# Patient Record
Sex: Female | Born: 1980 | Race: Black or African American | Hispanic: No | Marital: Single | Smoking: Never smoker
Health system: Southern US, Community
[De-identification: ages and names within clinical notes are randomized; demographics above are authoritative.]

## PROBLEM LIST (undated history)

## (undated) DIAGNOSIS — D649 Anemia, unspecified: Secondary | ICD-10-CM

## (undated) DIAGNOSIS — E611 Iron deficiency: Secondary | ICD-10-CM

## (undated) DIAGNOSIS — D219 Benign neoplasm of connective and other soft tissue, unspecified: Secondary | ICD-10-CM

## (undated) DIAGNOSIS — R7303 Prediabetes: Secondary | ICD-10-CM

## (undated) HISTORY — DX: Anemia, unspecified: D64.9

## (undated) HISTORY — DX: Prediabetes: R73.03

## (undated) HISTORY — DX: Benign neoplasm of connective and other soft tissue, unspecified: D21.9

## (undated) HISTORY — DX: Iron deficiency: E61.1

## (undated) HISTORY — PX: CHOLECYSTECTOMY: SHX55

---

## 2018-07-15 ENCOUNTER — Encounter: Payer: Self-pay | Admitting: Oncology

## 2018-07-15 ENCOUNTER — Telehealth: Payer: Self-pay | Admitting: Oncology

## 2018-07-15 NOTE — Telephone Encounter (Signed)
New hematology referral received from Dr. Garwin Brothers. Seth Bake from [facility] cld to schedule the pt an appt. Appt scheduled for the pt to see Dr. Alen Blew on 10/17 at 2pm. Letter mailed.

## 2018-08-11 ENCOUNTER — Telehealth: Payer: Self-pay | Admitting: Oncology

## 2018-08-11 ENCOUNTER — Inpatient Hospital Stay: Payer: BC Managed Care – PPO

## 2018-08-11 ENCOUNTER — Other Ambulatory Visit: Payer: Self-pay | Admitting: *Deleted

## 2018-08-11 ENCOUNTER — Inpatient Hospital Stay: Payer: BC Managed Care – PPO | Attending: Oncology | Admitting: Oncology

## 2018-08-11 VITALS — BP 148/95 | HR 79 | Temp 98.6°F | Resp 18 | Ht 65.0 in | Wt 373.3 lb

## 2018-08-11 DIAGNOSIS — D649 Anemia, unspecified: Secondary | ICD-10-CM

## 2018-08-11 DIAGNOSIS — D573 Sickle-cell trait: Secondary | ICD-10-CM | POA: Diagnosis not present

## 2018-08-11 DIAGNOSIS — N920 Excessive and frequent menstruation with regular cycle: Secondary | ICD-10-CM | POA: Diagnosis not present

## 2018-08-11 DIAGNOSIS — K912 Postsurgical malabsorption, not elsewhere classified: Secondary | ICD-10-CM | POA: Diagnosis not present

## 2018-08-11 DIAGNOSIS — Z9884 Bariatric surgery status: Secondary | ICD-10-CM | POA: Diagnosis not present

## 2018-08-11 DIAGNOSIS — D508 Other iron deficiency anemias: Secondary | ICD-10-CM | POA: Insufficient documentation

## 2018-08-11 LAB — CBC WITH DIFFERENTIAL (CANCER CENTER ONLY)
Abs Immature Granulocytes: 0.04 10*3/uL (ref 0.00–0.07)
Basophils Absolute: 0.1 10*3/uL (ref 0.0–0.1)
Basophils Relative: 1 %
Eosinophils Absolute: 0.1 10*3/uL (ref 0.0–0.5)
Eosinophils Relative: 1 %
HCT: 28.6 % — ABNORMAL LOW (ref 36.0–46.0)
Hemoglobin: 8.3 g/dL — ABNORMAL LOW (ref 12.0–15.0)
Immature Granulocytes: 1 %
Lymphocytes Relative: 27 %
Lymphs Abs: 2 10*3/uL (ref 0.7–4.0)
MCH: 18.7 pg — ABNORMAL LOW (ref 26.0–34.0)
MCHC: 29 g/dL — ABNORMAL LOW (ref 30.0–36.0)
MCV: 64.4 fL — ABNORMAL LOW (ref 80.0–100.0)
Monocytes Absolute: 0.5 10*3/uL (ref 0.1–1.0)
Monocytes Relative: 7 %
NEUTROS ABS: 4.7 10*3/uL (ref 1.7–7.7)
Neutrophils Relative %: 63 %
PLATELETS: 594 10*3/uL — AB (ref 150–400)
RBC: 4.44 MIL/uL (ref 3.87–5.11)
RDW: 18.2 % — ABNORMAL HIGH (ref 11.5–15.5)
WBC Count: 7.5 10*3/uL (ref 4.0–10.5)
nRBC: 0 % (ref 0.0–0.2)

## 2018-08-11 MED ORDER — FERROUS SULFATE 325 (65 FE) MG PO TBEC: 325.0000 mg | DELAYED_RELEASE_TABLET | Freq: Every day | ORAL | 3 refills | Status: DC

## 2018-08-11 NOTE — Telephone Encounter (Signed)
Correction Patient was given schedule for December and March.

## 2018-08-11 NOTE — Progress Notes (Signed)
Reason for the request:   Anemia  HPI: I was asked by Dr. Garwin Brothers to evaluate Elizabeth Skinner for the evaluation of anemia.  She is a 37 year old woman with history of gastric bypass operation in 2007.  She had a routine examination by Dr. Garwin Brothers laboratory testing in October 2018 showed a ferritin of 3, iron of 17, iron saturation 4% with binding capacity 432.  Her hemoglobin at the time was 8.4, MCV of 61 and white cell count of 7.7.  Blood count were 631 with elevated RDW of 16.6.  Hemoglobin electrophoresis obtained on October 30 showed a hemoglobin A of 67.8 and hemoglobin S of 29.1 indicating sickle cell trait.  She is mildly symptomatic from these findings without any excessive fatigue, tiredness or shortness of breath.  She does report regular menstrual cycles lasting close to 5 days every month.  He denies any hematochezia or melena.  She has been diagnosed with uterine fibroids and contemplating options for treatment including ablation, embolization and hysterectomy.  Overall performance status quality of life remain excellent.  She does not report any headaches, blurry vision, syncope or seizures. Does not report any fevers, chills or sweats.  Does not report any cough, wheezing or hemoptysis.  Does not report any chest pain, palpitation, orthopnea or leg edema.  Does not report any nausea, vomiting or abdominal pain.  Does not report any constipation or diarrhea.  Does not report any skeletal complaints.    Does not report frequency, urgency or hematuria.  Does not report any skin rashes or lesions. Does not report any heat or cold intolerance.  Does not report any lymphadenopathy or petechiae.  Does not report any anxiety or depression.  Remaining review of systems is negative.    No past medical history significant for hypertension, diabetes or coronary disease.  Status post bypass operation as well as cholecystectomy.    No known allergies.  No family history of any blood or malignant  disorder.  Social History   Socioeconomic History  . Marital status: Single    Spouse name: Not on file  . Number of children: Not on file  . Years of education: Not on file  . Highest education level: Not on file  Occupational History  . Not on file  Social Needs  . Financial resource strain: Not on file  . Food insecurity:    Worry: Not on file    Inability: Not on file  . Transportation needs:    Medical: Not on file    Non-medical: Not on file  Tobacco Use  . Smoking status: Not on file  Substance and Sexual Activity  . Alcohol use: Not on file  . Drug use: Not on file  . Sexual activity: Not on file  Lifestyle  . Physical activity:    Days per week: Not on file    Minutes per session: Not on file  . Stress: Not on file  Relationships  . Social connections:    Talks on phone: Not on file    Gets together: Not on file    Attends religious service: Not on file    Active member of club or organization: Not on file    Attends meetings of clubs or organizations: Not on file    Relationship status: Not on file  . Intimate partner violence:    Fear of current or ex partner: Not on file    Emotionally abused: Not on file    Physically abused: Not on file  Forced sexual activity: Not on file  Other Topics Concern  . Not on file  Social History Narrative  . Not on file  :  Pertinent items are noted in HPI.  Exam: Blood pressure (!) 148/95, pulse 79, temperature 98.6 F (37 C), temperature source Oral, resp. rate 18, height 5\' 5"  (1.651 m), weight (!) 373 lb 4.8 oz (169.3 kg), SpO2 100 %.   General appearance: alert and cooperative appeared without distress. Head: atraumatic without any abnormalities. Eyes: conjunctivae/corneas clear. PERRL.  Sclera anicteric. Throat: lips, mucosa, and tongue normal; without oral thrush or ulcers. Resp: clear to auscultation bilaterally without rhonchi, wheezes or dullness to percussion. Cardio: regular rate and rhythm, S1, S2  normal, no murmur, click, rub or gallop GI: soft, non-tender; bowel sounds normal; no masses,  no organomegaly Skin: Skin color, texture, turgor normal. No rashes or lesions Lymph nodes: Cervical, supraclavicular, and axillary nodes normal. Neurologic: Grossly normal without any motor, sensory or deep tendon reflexes. Musculoskeletal: No joint deformity or effusion.    Assessment and Plan:   37 year old woman with the following:  1.  Iron deficiency anemia diagnosed in October 2019.  Her hemoglobin is around 8 with low MCV and ferritin of 3.  Her iron deficiency is multifactorial related to chronic menstrual blood losses in addition to poor absorption issues related to gastric bypass operation.  Treatment options were reviewed including oral iron replacement versus intravenous IV iron in the form of Feraheme.  Risks and benefits of both approaches were reviewed today including complication associated with oral iron absorption.  Dyspepsia, constipation and abdominal pain is associated with oral iron.  Intravenous iron benefit would include ensuring adequate absorption as well as rapid replacement of her iron.  Infusion related complications associated with intravenous iron were reviewed including rarely anaphylaxis.  After discussion she is agreeable to proceed with Feraheme for a total of 1000 mg split doses I will also start her oral iron maintenance therapy as well.  I anticipate correction of her iron and hemoglobin in the next 4 to 6 weeks.  2.  Sickle cell trait: She is asymptomatic from this finding and likely her baseline hemoglobin and microcytosis will be less than normal but will be assessed after adequate iron replacement.  3.  Follow-up: We will be in 3 months to follow her progress.  40  minutes was spent with the patient face-to-face today.  More than 50% of time was dedicated to reviewing laboratory data, options of therapy and complications related to treatment.   Thank you  for the referral.  A copy of this consult has been forwarded to the requesting physician.

## 2018-08-11 NOTE — Telephone Encounter (Signed)
Managed cared informed of iron 12/23.

## 2018-08-11 NOTE — Telephone Encounter (Signed)
Gave patient avs report and appointments for December and January  °

## 2018-08-12 LAB — FERRITIN: Ferritin: <4 ng/mL — ABNORMAL LOW (ref 11–307)

## 2018-08-12 LAB — IRON AND TIBC
Iron: 43 ug/dL (ref 41–142)
Saturation Ratios: 10 % — ABNORMAL LOW (ref 21–57)
TIBC: 418 ug/dL (ref 236–444)
UIBC: 374 ug/dL (ref 120–384)

## 2018-08-17 ENCOUNTER — Inpatient Hospital Stay: Payer: BC Managed Care – PPO

## 2018-08-17 VITALS — BP 140/89 | HR 72 | Temp 98.0°F | Resp 18

## 2018-08-17 DIAGNOSIS — D508 Other iron deficiency anemias: Secondary | ICD-10-CM | POA: Diagnosis not present

## 2018-08-17 DIAGNOSIS — D649 Anemia, unspecified: Secondary | ICD-10-CM

## 2018-08-17 MED ORDER — SODIUM CHLORIDE 0.9 % IV SOLN
Freq: Once | INTRAVENOUS | Status: AC
  Administered 2018-08-17: 08:00:00 via INTRAVENOUS
  Filled 2018-08-17: qty 250

## 2018-08-17 MED ORDER — SODIUM CHLORIDE 0.9 % IV SOLN
510.0000 mg | Freq: Once | INTRAVENOUS | Status: AC
  Administered 2018-08-17: 510 mg via INTRAVENOUS
  Filled 2018-08-17: qty 17

## 2018-08-17 NOTE — Patient Instructions (Signed)

## 2018-08-24 ENCOUNTER — Inpatient Hospital Stay: Payer: BC Managed Care – PPO

## 2018-08-29 ENCOUNTER — Inpatient Hospital Stay: Payer: BC Managed Care – PPO | Attending: Oncology

## 2018-08-29 VITALS — BP 114/75 | HR 62 | Temp 97.8°F | Resp 16

## 2018-08-29 DIAGNOSIS — D508 Other iron deficiency anemias: Secondary | ICD-10-CM | POA: Diagnosis present

## 2018-08-29 DIAGNOSIS — K912 Postsurgical malabsorption, not elsewhere classified: Secondary | ICD-10-CM | POA: Insufficient documentation

## 2018-08-29 DIAGNOSIS — D573 Sickle-cell trait: Secondary | ICD-10-CM | POA: Insufficient documentation

## 2018-08-29 DIAGNOSIS — D649 Anemia, unspecified: Secondary | ICD-10-CM

## 2018-08-29 DIAGNOSIS — Z9884 Bariatric surgery status: Secondary | ICD-10-CM | POA: Insufficient documentation

## 2018-08-29 DIAGNOSIS — N920 Excessive and frequent menstruation with regular cycle: Secondary | ICD-10-CM | POA: Diagnosis not present

## 2018-08-29 MED ORDER — SODIUM CHLORIDE 0.9 % IV SOLN
Freq: Once | INTRAVENOUS | Status: AC
  Administered 2018-08-29: 13:00:00 via INTRAVENOUS
  Filled 2018-08-29: qty 250

## 2018-08-29 MED ORDER — SODIUM CHLORIDE 0.9 % IV SOLN
510.0000 mg | Freq: Once | INTRAVENOUS | Status: AC
  Administered 2018-08-29: 510 mg via INTRAVENOUS
  Filled 2018-08-29: qty 17

## 2018-08-29 NOTE — Patient Instructions (Signed)

## 2018-09-22 ENCOUNTER — Encounter (INDEPENDENT_AMBULATORY_CARE_PROVIDER_SITE_OTHER): Payer: BC Managed Care – PPO

## 2018-09-24 ENCOUNTER — Encounter (INDEPENDENT_AMBULATORY_CARE_PROVIDER_SITE_OTHER): Payer: Self-pay | Admitting: Bariatrics

## 2018-09-24 ENCOUNTER — Ambulatory Visit (INDEPENDENT_AMBULATORY_CARE_PROVIDER_SITE_OTHER): Payer: BC Managed Care – PPO | Admitting: Bariatrics

## 2018-09-24 VITALS — BP 124/82 | HR 65 | Temp 98.0°F | Ht 66.0 in | Wt 363.0 lb

## 2018-09-24 DIAGNOSIS — Z1331 Encounter for screening for depression: Secondary | ICD-10-CM | POA: Diagnosis not present

## 2018-09-24 DIAGNOSIS — R5383 Other fatigue: Secondary | ICD-10-CM | POA: Diagnosis not present

## 2018-09-24 DIAGNOSIS — D508 Other iron deficiency anemias: Secondary | ICD-10-CM

## 2018-09-24 DIAGNOSIS — R7303 Prediabetes: Secondary | ICD-10-CM | POA: Diagnosis not present

## 2018-09-24 DIAGNOSIS — Z9889 Other specified postprocedural states: Secondary | ICD-10-CM

## 2018-09-24 DIAGNOSIS — Z6841 Body Mass Index (BMI) 40.0 and over, adult: Secondary | ICD-10-CM

## 2018-09-24 DIAGNOSIS — E66813 Obesity, class 3: Secondary | ICD-10-CM

## 2018-09-24 DIAGNOSIS — R0602 Shortness of breath: Secondary | ICD-10-CM | POA: Diagnosis not present

## 2018-09-24 DIAGNOSIS — Z9189 Other specified personal risk factors, not elsewhere classified: Secondary | ICD-10-CM | POA: Diagnosis not present

## 2018-09-24 DIAGNOSIS — E46 Unspecified protein-calorie malnutrition: Secondary | ICD-10-CM

## 2018-09-24 DIAGNOSIS — Z0289 Encounter for other administrative examinations: Secondary | ICD-10-CM

## 2018-09-25 LAB — PREALBUMIN: PREALBUMIN: 16 mg/dL (ref 14–35)

## 2018-09-25 LAB — COMPREHENSIVE METABOLIC PANEL
ALT: 15 IU/L (ref 0–32)
AST: 14 IU/L (ref 0–40)
Albumin/Globulin Ratio: 1.3 (ref 1.2–2.2)
Albumin: 4 g/dL (ref 3.8–4.8)
Alkaline Phosphatase: 81 IU/L (ref 39–117)
BUN/Creatinine Ratio: 12 (ref 9–23)
BUN: 11 mg/dL (ref 6–20)
Bilirubin Total: 0.3 mg/dL (ref 0.0–1.2)
CO2: 25 mmol/L (ref 20–29)
Calcium: 9.3 mg/dL (ref 8.7–10.2)
Chloride: 99 mmol/L (ref 96–106)
Creatinine, Ser: 0.93 mg/dL (ref 0.57–1.00)
GFR calc Af Amer: 90 mL/min/{1.73_m2} (ref >59)
GFR calc non Af Amer: 78 mL/min/{1.73_m2} (ref >59)
GLUCOSE: 83 mg/dL (ref 65–99)
Globulin, Total: 3.2 g/dL (ref 1.5–4.5)
Potassium: 4.7 mmol/L (ref 3.5–5.2)
Sodium: 138 mmol/L (ref 134–144)
Total Protein: 7.2 g/dL (ref 6.0–8.5)

## 2018-09-25 LAB — INSULIN, RANDOM: INSULIN: 7.7 u[IU]/mL (ref 2.6–24.9)

## 2018-09-25 LAB — FOLATE: Folate: >20 ng/mL (ref >3.0)

## 2018-09-25 LAB — VITAMIN D 25 HYDROXY (VIT D DEFICIENCY, FRACTURES): Vit D, 25-Hydroxy: 10.3 ng/mL — ABNORMAL LOW (ref 30.0–100.0)

## 2018-09-25 LAB — VITAMIN B12: Vitamin B-12: 258 pg/mL (ref 232–1245)

## 2018-09-28 DIAGNOSIS — Z6841 Body Mass Index (BMI) 40.0 and over, adult: Secondary | ICD-10-CM

## 2018-09-28 DIAGNOSIS — R7303 Prediabetes: Secondary | ICD-10-CM | POA: Insufficient documentation

## 2018-09-28 DIAGNOSIS — Z9889 Other specified postprocedural states: Secondary | ICD-10-CM | POA: Insufficient documentation

## 2018-09-28 DIAGNOSIS — E46 Unspecified protein-calorie malnutrition: Secondary | ICD-10-CM | POA: Insufficient documentation

## 2018-09-28 NOTE — Progress Notes (Signed)
Office: [phone]  /  Fax: [phone]   Dear Dr. Addison Lank,   Thank you for referring Aiden Rao to our clinic. The following note includes my evaluation and treatment recommendations.  HPI:   Chief Complaint: OBESITY    Amiley Shishido has been referred by Cari Caraway, MD for consultation regarding her obesity and obesity related comorbidities.    Elizabeth Skinner (MR# [id number]) is a 38 y.o. female who presents on 09/24/2018 for obesity evaluation and treatment. Current BMI is Body mass index is 58.59 kg/m.Marland Kitchen Falicity has been struggling with her weight for many years and has been unsuccessful in either losing weight, maintaining weight loss, or reaching her healthy weight goal.     Sheilyn attended our information session and states she is currently in the action stage of change and ready to dedicate time achieving and maintaining a healthier weight. Caylea is interested in becoming our patient and working on intensive lifestyle modifications including (but not limited to) diet, exercise and weight loss.    Georgann states her desired weight loss is 113 lbs she has been heavy most of  her life she started gaining weight in elementary school her heaviest weight ever was 470 lbs. she dislikes seafood she has significant food cravings issues  she snacks frequently in the evenings she does skip meals (breakfast) she is frequently drinking liquids with calories she has problems with excessive hunger  she struggles with emotional eating    History of Gastric Bypass (Roux-en-Y) Avon has a history of gastric bypass approximately in 2007; no co-morbidities, done in [place]. Her highest weight was at 470 pounds and lowest weight was at 240 pounds.  Fatigue Jolee feels her energy is lower than it should be. This has worsened with weight gain and has not worsened recently. Elice admits to daytime somnolence and she denies waking up still tired. Patient is at risk for obstructive sleep apnea. Patent  has a history of symptoms of daytime fatigue. Patient generally gets 7 or 8 hours of sleep per night, and states they generally have restful sleep. Snoring is present. Apneic episodes are not present. Epworth Sleepiness Score is 5  Dyspnea on exertion Armida notes increasing shortness of breath with certain activities (going up stairs) and seems to be worsening over time with weight gain. She notes getting out of breath sooner with activity than she used to. This has not gotten worse recently. Glennys denies orthopnea.  Iron Deficiency Anemia Isabellamarie has a diagnosis of anemia. She has a history of sickle cell trait and uterine fibroids. Zakiyah is taking oral iron supplementation and prenatal vitamins. She has had two iron infusions.  Pre-Diabetes Isobella has a diagnosis of prediabetes based on her elevated Hgb A1c and was informed this puts her at greater risk of developing diabetes. Her last A1c was at 6.1 on 07/02/18. She is not taking metformin currently and continues to work on diet and exercise to decrease risk of diabetes. She denies polyphagia.  At risk for diabetes Savannha is at higher than average risk for developing diabetes due to her obesity and prediabetes. She currently denies polyuria or polydipsia.  Malnutrition Tashiana has a diagnosis of malnutrition and she has a history of gastric bypass.  Depression Screen Kennis's Food and Mood (modified PHQ-9) score was  Depression screen PHQ 2/9 09/24/2018  Decreased Interest 0  Down, Depressed, Hopeless 0  PHQ - 2 Score 0  Altered sleeping 0  Tired, decreased energy 1  Change in appetite 1  Feeling bad or  failure about yourself  0  Trouble concentrating 0  Moving slowly or fidgety/restless 0  Suicidal thoughts 0  PHQ-9 Score 2  Difficult doing work/chores Not difficult at all    ASSESSMENT AND PLAN:  Other fatigue - Plan: EKG 12-Lead  Shortness of breath on exertion  Other iron deficiency anemia  Prediabetes - Plan: Insulin, random,  Comprehensive metabolic panel  Malnutrition, unspecified type (HCC) - Plan: Vitamin B12, Folate, VITAMIN D 25 Hydroxy (Vit-D Deficiency, Fractures), Prealbumin  Depression screening  History of gastric surgery  At risk for diabetes mellitus  Class 3 severe obesity with serious comorbidity and body mass index (BMI) of 50.0 to 59.9 in adult, unspecified obesity type ([place])  PLAN:  Fatigue Barry was informed that her fatigue may be related to obesity, depression or many other causes. Labs will be ordered, and in the meanwhile Earlean has agreed to work on diet, exercise and weight loss to help with fatigue. Proper sleep hygiene was discussed including the need for 7-8 hours of quality sleep each night. A sleep study was not ordered based on symptoms and Epworth score.  Dyspnea on exertion Doreene's shortness of breath appears to be obesity related and exercise induced. She has agreed to work on weight loss and gradually increase exercise to treat her exercise induced shortness of breath. If Veronia follows our instructions and loses weight without improvement of her shortness of breath, we will plan to refer to pulmonology. We will monitor this condition regularly. Susette agrees to this plan.  Iron Deficiency Anemia The diagnosis of Iron deficiency anemia was discussed with Afreen will follow up with hematology. She was given suggestions of iron rich foods.  Pre-Diabetes Lucia will continue to work on weight loss, exercise, and decreasing simple carbohydrates in her diet to help decrease the risk of diabetes. She was informed that eating too many simple carbohydrates or too many calories at one sitting increases the likelihood of GI side effects. We will check insulin level today and Azhane agreed to follow up with [place] as directed to monitor her progress.  Diabetes risk counseling Camaryn was given extended (15 minutes) diabetes prevention counseling today. She is 38 y.o. female and has risk factors for  diabetes including obesity and prediabetes. We discussed intensive lifestyle modifications today with an emphasis on weight loss as well as increasing exercise and decreasing simple carbohydrates in her diet.  Malnutrition We will check B1, B6, B12 and prealbumin today. Celine will follow up with our clinic in 2 weeks.  Depression Screen Sandara had a negative depression screening. Depression is commonly associated with obesity and often results in emotional eating behaviors. We will monitor this closely and work on CBT to help improve the non-hunger eating patterns.   History of Gastric Bypass (Roux-en-Y) We will do B1, B6, B12, vitamin D and Prealbumin today. Treasa will follow up with our clinic in 2 weeks.  Obesity Salimatou is currently in the action stage of change and her goal is to continue with weight loss efforts. I recommend Jamaris begin the structured treatment plan as follows:  She has agreed to follow the Category 3 plan Meyer has been instructed to eventually work up to a goal of 150 minutes of combined cardio and strengthening exercise per week for weight loss and overall health benefits. We discussed the following Behavioral Modification Strategies today: increase H2O intake, keeping healthy foods in the home, increasing lean protein intake, decreasing simple carbohydrates, increasing vegetables, work on meal planning and intentional eating and  decrease liquid calories   She was informed of the importance of frequent follow up visits to maximize her success with intensive lifestyle modifications for her multiple health conditions. She was informed we would discuss her lab results at her next visit unless there is a critical issue that needs to be addressed sooner. Janara agreed to keep her next visit at the agreed upon time to discuss these results.  ALLERGIES: No Known Allergies  MEDICATIONS: Current Outpatient Medications on File Prior to Visit  Medication Sig Dispense Refill  .  ferrous sulfate 325 (65 FE) MG EC tablet Take 1 tablet (325 mg total) by mouth daily with breakfast. 90 tablet 3  . Prenatal Vit-Fe Fumarate-FA (PRENATAL MULTIVITAMIN) TABS tablet Take 1 tablet by mouth daily at 12 noon.     No current facility-administered medications on file prior to visit.     PAST MEDICAL HISTORY: Past Medical History:  Diagnosis Date  . Anemia   . Fibroids   . Iron deficiency   . Pre-diabetes     PAST SURGICAL HISTORY: Past Surgical History:  Procedure Laterality Date  . CHOLECYSTECTOMY      SOCIAL HISTORY: Social History   Tobacco Use  . Smoking status: Never Smoker  . Smokeless tobacco: Never Used  Substance Use Topics  . Alcohol use: Not on file  . Drug use: Not on file    FAMILY HISTORY: Family History  Problem Relation Age of Onset  . Diabetes Mother   . Obesity Mother     ROS: Review of Systems  Constitutional: Positive for malaise/fatigue.  Respiratory: Positive for shortness of breath (on exertion).   Cardiovascular: Negative for orthopnea.  Genitourinary: Negative for frequency.  Endo/Heme/Allergies: Negative for polydipsia.       Negative for polyphagia    PHYSICAL EXAM: Blood pressure 124/82, pulse 65, temperature 98 F (36.7 C), temperature source Oral, height 5\' 6"  (1.676 m), weight (!) 363 lb (164.7 kg), last menstrual period 09/04/2018, SpO2 100 %. Body mass index is 58.59 kg/m. Physical Exam Vitals signs reviewed.  Constitutional:      Appearance: Normal appearance. She is well-developed. She is obese.  HENT:     Head: Normocephalic and atraumatic.     Nose: Nose normal.     Mouth/Throat:     Comments: Mallampati = 4 Eyes:     General: No scleral icterus.    Extraocular Movements: Extraocular movements intact.  Neck:     Musculoskeletal: Normal range of motion and neck supple.     Thyroid: No thyromegaly.  Cardiovascular:     Rate and Rhythm: Normal rate and regular rhythm.  Pulmonary:     Effort: Pulmonary  effort is normal. No respiratory distress.  Abdominal:     Palpations: Abdomen is soft.     Tenderness: There is no abdominal tenderness.  Musculoskeletal: Normal range of motion.     Right lower leg: Edema present.     Left lower leg: Edema present.     Comments: Range of Motion normal in all 4 extremities  Skin:    General: Skin is warm and dry.  Neurological:     Mental Status: She is alert and oriented to person, place, and time.     Coordination: Coordination normal.  Psychiatric:        Mood and Affect: Mood normal.        Behavior: Behavior normal.     RECENT LABS AND TESTS: BMET    Component Value Date/Time   NA 138  09/24/2018 1426   K 4.7 09/24/2018 1426   CL 99 09/24/2018 1426   CO2 25 09/24/2018 1426   GLUCOSE 83 09/24/2018 1426   BUN 11 09/24/2018 1426   CREATININE 0.93 09/24/2018 1426   CALCIUM 9.3 09/24/2018 1426   GFRNONAA 78 09/24/2018 1426   GFRAA 90 09/24/2018 1426   No results found for: HGBA1C Lab Results  Component Value Date   INSULIN 7.7 09/24/2018   CBC    Component Value Date/Time   WBC 7.5 08/11/2018 1437   RBC 4.44 08/11/2018 1437   HGB 8.3 (L) 08/11/2018 1437   HCT 28.6 (L) 08/11/2018 1437   PLT 594 (H) 08/11/2018 1437   MCV 64.4 (L) 08/11/2018 1437   MCH 18.7 (L) 08/11/2018 1437   MCHC 29.0 (L) 08/11/2018 1437   RDW 18.2 (H) 08/11/2018 1437   LYMPHSABS 2.0 08/11/2018 1437   MONOABS 0.5 08/11/2018 1437   EOSABS 0.1 08/11/2018 1437   BASOSABS 0.1 08/11/2018 1437   Iron/TIBC/Ferritin/ %Sat    Component Value Date/Time   IRON 43 08/11/2018 1438   TIBC 418 08/11/2018 1438   FERRITIN <4 (L) 08/11/2018 1438   IRONPCTSAT 10 (L) 08/11/2018 1438   Lipid Panel  No results found for: CHOL, TRIG, HDL, CHOLHDL, VLDL, LDLCALC, LDLDIRECT Hepatic Function Panel     Component Value Date/Time   PROT 7.2 09/24/2018 1426   ALBUMIN 4.0 09/24/2018 1426   AST 14 09/24/2018 1426   ALT 15 09/24/2018 1426   ALKPHOS 81 09/24/2018 1426    BILITOT 0.3 09/24/2018 1426   No results found for: TSH  ECG  shows NSR with a rate of 70 BPM INDIRECT CALORIMETER done today shows a VO2 of 298 and a REE of 2078.  Her calculated basal metabolic rate is 7322 thus her basal metabolic rate is worse than expected.    OBESITY BEHAVIORAL INTERVENTION VISIT  Today's visit was # 1   Starting weight: 363 lbs Starting date: 09/24/2018 Today's weight : 363 lbs  Today's date: 09/24/2018 Total lbs lost to date: 0   ASK: We discussed the diagnosis of obesity with Kathy Breach today and Diondra agreed to give [place] permission to discuss obesity behavioral modification therapy today.  ASSESS: Laquetta has the diagnosis of obesity and her BMI today is 58.62 [place] is in the action stage of change   ADVISE: Maizie was educated on the multiple health risks of obesity as well as the benefit of weight loss to improve her health. She was advised of the need for long term treatment and the importance of lifestyle modifications to improve her current health and to decrease her risk of future health problems.  AGREE: Multiple dietary modification options and treatment options were discussed and  Genavive agreed to follow the recommendations documented in the above note.  ARRANGE: Adair was educated on the importance of frequent visits to treat obesity as outlined per CMS and USPSTF guidelines and agreed to schedule her next follow up appointment today.  Corey Skains, am acting as Location manager for [employer]. Owens Shark, DO  I have reviewed the above documentation for accuracy and completeness, and I agree with the above. -Jearld Lesch, DO

## 2018-10-06 ENCOUNTER — Telehealth: Payer: Self-pay | Admitting: Oncology

## 2018-10-06 NOTE — Telephone Encounter (Signed)
Patient called to reschedule  °

## 2018-10-12 ENCOUNTER — Encounter (INDEPENDENT_AMBULATORY_CARE_PROVIDER_SITE_OTHER): Payer: Self-pay | Admitting: Bariatrics

## 2018-10-12 ENCOUNTER — Ambulatory Visit (INDEPENDENT_AMBULATORY_CARE_PROVIDER_SITE_OTHER): Payer: BC Managed Care – PPO | Admitting: Bariatrics

## 2018-10-12 VITALS — BP 137/89 | HR 82 | Temp 98.6°F | Ht 66.0 in | Wt 368.0 lb

## 2018-10-12 DIAGNOSIS — R7303 Prediabetes: Secondary | ICD-10-CM | POA: Diagnosis not present

## 2018-10-12 DIAGNOSIS — E559 Vitamin D deficiency, unspecified: Secondary | ICD-10-CM

## 2018-10-12 DIAGNOSIS — Z9189 Other specified personal risk factors, not elsewhere classified: Secondary | ICD-10-CM

## 2018-10-12 DIAGNOSIS — Z9889 Other specified postprocedural states: Secondary | ICD-10-CM

## 2018-10-12 DIAGNOSIS — Z6841 Body Mass Index (BMI) 40.0 and over, adult: Secondary | ICD-10-CM

## 2018-10-12 MED ORDER — VITAMIN D (ERGOCALCIFEROL) 1.25 MG (50000 UNIT) PO CAPS: 50000.0000 [IU] | ORAL_CAPSULE | ORAL | 0 refills | Status: DC

## 2018-10-12 NOTE — Progress Notes (Signed)
Office: [phone]  /  Fax: [phone]   HPI:   Chief Complaint: OBESITY Elizabeth Skinner is here to discuss her progress with her obesity treatment plan. She is on the Category 3 plan and is following her eating plan approximately 0 % of the time. She states she is exercising 0 minutes 0 times per week. Elizabeth Skinner states that she did not follow the plan secondary to conflicts and she had to go out of town. Her weight is (!) 368 lb (166.9 kg) today and has had a weight gain of 5 pounds over a period of 2 weeks since her last visit. She has gained 5 lbs since starting treatment with [place].  History of Gastric Bypass Elizabeth Skinner has a history of gastric bypass and she has some restriction. She has low normal B12.  Vitamin D deficiency Elizabeth Skinner has a diagnosis of vitamin D deficiency. Her last vitamin D level was at 10.3 She is not currently taking vit D and denies nausea, vomiting or muscle weakness.  At risk for osteopenia and osteoporosis Elizabeth Skinner is at higher risk of osteopenia and osteoporosis due to vitamin D deficiency.   Pre-Diabetes Elizabeth Skinner has a diagnosis of prediabetes based on her elevated Hgb A1c and was informed this puts her at greater risk of developing diabetes. Her last A1c was at 6.1 on 07/12/18 and last insulin level was at 7.7 She is not taking medications currently and continues to work on diet and exercise to decrease risk of diabetes. She denies nausea or hypoglycemia.  ASSESSMENT AND PLAN:  Vitamin D deficiency  Prediabetes  History of gastric surgery  At risk for osteoporosis  Class 3 severe obesity with serious comorbidity and body mass index (BMI) of 50.0 to 59.9 in adult, unspecified obesity type ([place])  PLAN:  Vitamin D Deficiency Elizabeth Skinner was informed that low vitamin D levels contributes to fatigue and are associated with obesity, breast, and colon cancer. She agrees to start prescription Vit D @50 ,000 IU every week #4 with no refills and will follow up for routine testing of  vitamin D, at least 2-3 times per year. She was informed of the risk of over-replacement of vitamin D and agrees to not increase her dose unless she discusses this with [place] first. Elizabeth Skinner agrees to follow up as directed.  At risk for osteopenia and osteoporosis Elizabeth Skinner was given extended  (15 minutes) osteoporosis prevention counseling today. Elizabeth Skinner is at risk for osteopenia and osteoporosis due to her vitamin D deficiency. She was encouraged to take her vitamin D and follow her higher calcium diet and increase strengthening exercise to help strengthen her bones and decrease her risk of osteopenia and osteoporosis.  Pre-Diabetes Elizabeth Skinner will continue to work on weight loss, exercise, and decreasing simple carbohydrates in her diet to help decrease the risk of diabetes. We dicussed pre-diabetes in detail with myself and our registered dietician. She was informed that eating too many simple carbohydrates or too many calories at one sitting increases the likelihood of GI side effects. Elizabeth Skinner agreed to follow up with [place] as directed to monitor her progress.  History of Gastric Bypass Elizabeth Skinner will continue taking prenatal vitamins and iron. She will begin to take OTC B12 supplement.  Obesity Elizabeth Skinner is currently in the action stage of change. As such, her goal is to continue with weight loss efforts She has agreed to follow the Category 3 plan Elizabeth Skinner has been instructed to work up to a goal of 150 minutes of combined cardio and strengthening exercise per week  for weight loss and overall health benefits. We discussed the following Behavioral Modification Strategies today: increase H2O intake, keeping healthy foods in the home, increasing lean protein intake, decreasing simple carbohydrates, increasing vegetables and work on meal planning and easy cooking plans  Elizabeth Skinner has agreed to follow up with our clinic in 2 weeks. She was informed of the importance of frequent follow up visits to maximize her success with  intensive lifestyle modifications for her multiple health conditions.  ALLERGIES: No Known Allergies  MEDICATIONS: Current Outpatient Medications on File Prior to Visit  Medication Sig Dispense Refill  . ferrous sulfate 325 (65 FE) MG EC tablet Take 1 tablet (325 mg total) by mouth daily with breakfast. 90 tablet 3  . Prenatal Vit-Fe Fumarate-FA (PRENATAL MULTIVITAMIN) TABS tablet Take 1 tablet by mouth daily at 12 noon.     No current facility-administered medications on file prior to visit.     PAST MEDICAL HISTORY: Past Medical History:  Diagnosis Date  . Anemia   . Fibroids   . Iron deficiency   . Pre-diabetes     PAST SURGICAL HISTORY: Past Surgical History:  Procedure Laterality Date  . CHOLECYSTECTOMY      SOCIAL HISTORY: Social History   Tobacco Use  . Smoking status: Never Smoker  . Smokeless tobacco: Never Used  Substance Use Topics  . Alcohol use: Not on file  . Drug use: Not on file    FAMILY HISTORY: Family History  Problem Relation Age of Onset  . Diabetes Mother   . Obesity Mother     ROS: Review of Systems  Constitutional: Negative for weight loss.  Gastrointestinal: Negative for nausea and vomiting.  Musculoskeletal:       Negative for muscle weakness  Endo/Heme/Allergies:       Negative for hypgoglycemia    PHYSICAL EXAM: Blood pressure 137/89, pulse 82, temperature 98.6 F (37 C), temperature source Oral, height 5\' 6"  (1.676 m), weight (!) 368 lb (166.9 kg), SpO2 100 %. Body mass index is 59.4 kg/m. Physical Exam Vitals signs reviewed.  Constitutional:      Appearance: Normal appearance. She is well-developed. She is obese.  Cardiovascular:     Rate and Rhythm: Normal rate.  Pulmonary:     Effort: Pulmonary effort is normal.  Musculoskeletal: Normal range of motion.  Skin:    General: Skin is warm and dry.  Neurological:     Mental Status: She is alert and oriented to person, place, and time.  Psychiatric:        Mood and  Affect: Mood normal.        Behavior: Behavior normal.     RECENT LABS AND TESTS: BMET    Component Value Date/Time   NA 138 09/24/2018 1426   K 4.7 09/24/2018 1426   CL 99 09/24/2018 1426   CO2 25 09/24/2018 1426   GLUCOSE 83 09/24/2018 1426   BUN 11 09/24/2018 1426   CREATININE 0.93 09/24/2018 1426   CALCIUM 9.3 09/24/2018 1426   GFRNONAA 78 09/24/2018 1426   GFRAA 90 09/24/2018 1426   No results found for: HGBA1C Lab Results  Component Value Date   INSULIN 7.7 09/24/2018   CBC    Component Value Date/Time   WBC 7.5 08/11/2018 1437   RBC 4.44 08/11/2018 1437   HGB 8.3 (L) 08/11/2018 1437   HCT 28.6 (L) 08/11/2018 1437   PLT 594 (H) 08/11/2018 1437   MCV 64.4 (L) 08/11/2018 1437   MCH 18.7 (L) 08/11/2018 1437  MCHC 29.0 (L) 08/11/2018 1437   RDW 18.2 (H) 08/11/2018 1437   LYMPHSABS 2.0 08/11/2018 1437   MONOABS 0.5 08/11/2018 1437   EOSABS 0.1 08/11/2018 1437   BASOSABS 0.1 08/11/2018 1437   Iron/TIBC/Ferritin/ %Sat    Component Value Date/Time   IRON 43 08/11/2018 1438   TIBC 418 08/11/2018 1438   FERRITIN <4 (L) 08/11/2018 1438   IRONPCTSAT 10 (L) 08/11/2018 1438   Lipid Panel  No results found for: CHOL, TRIG, HDL, CHOLHDL, VLDL, LDLCALC, LDLDIRECT Hepatic Function Panel     Component Value Date/Time   PROT 7.2 09/24/2018 1426   ALBUMIN 4.0 09/24/2018 1426   AST 14 09/24/2018 1426   ALT 15 09/24/2018 1426   ALKPHOS 81 09/24/2018 1426   BILITOT 0.3 09/24/2018 1426   No results found for: TSH   Ref. Range 09/24/2018 14:26  Vitamin D, 25-Hydroxy Latest Ref Range: 30.0 - 100.0 ng/mL 10.3 (L)     OBESITY BEHAVIORAL INTERVENTION VISIT  Today's visit was # 2   Starting weight: 363 lbs Starting date: 09/24/2018 Today's weight : 368 lbs  Today's date: 10/12/2018 Total lbs lost to date: 0   ASK: We discussed the diagnosis of obesity with Elizabeth Skinner today and Elizabeth Skinner agreed to give [place] permission to discuss obesity behavioral modification therapy  today.  ASSESS: Elizabeth Skinner has the diagnosis of obesity and her BMI today is 59.43 Elizabeth Skinner is in the action stage of change   ADVISE: Elizabeth Skinner was educated on the multiple health risks of obesity as well as the benefit of weight loss to improve her health. She was advised of the need for long term treatment and the importance of lifestyle modifications to improve her current health and to decrease her risk of future health problems.  AGREE: Multiple dietary modification options and treatment options were discussed and  Gregg agreed to follow the recommendations documented in the above note.  ARRANGE: Tyrah was educated on the importance of frequent visits to treat obesity as outlined per CMS and USPSTF guidelines and agreed to schedule her next follow up appointment today.  Corey Skains, am acting as Location manager for [employer]. Owens Shark, DO  I have reviewed the above documentation for accuracy and completeness, and I agree with the above. -Jearld Lesch, DO

## 2018-10-13 DIAGNOSIS — E559 Vitamin D deficiency, unspecified: Secondary | ICD-10-CM | POA: Insufficient documentation

## 2018-10-24 ENCOUNTER — Encounter (INDEPENDENT_AMBULATORY_CARE_PROVIDER_SITE_OTHER): Payer: Self-pay | Admitting: Bariatrics

## 2018-10-28 ENCOUNTER — Ambulatory Visit (INDEPENDENT_AMBULATORY_CARE_PROVIDER_SITE_OTHER): Payer: BC Managed Care – PPO | Admitting: Bariatrics

## 2018-11-13 ENCOUNTER — Other Ambulatory Visit: Payer: BC Managed Care – PPO

## 2018-11-13 ENCOUNTER — Ambulatory Visit: Payer: BC Managed Care – PPO | Admitting: Oncology

## 2018-11-17 ENCOUNTER — Other Ambulatory Visit: Payer: Self-pay | Admitting: Oncology

## 2018-11-17 ENCOUNTER — Telehealth: Payer: Self-pay | Admitting: Oncology

## 2018-11-17 NOTE — Telephone Encounter (Signed)
R/s appt per 3/24 sch message - left message for patient with appt date and time

## 2018-11-19 ENCOUNTER — Encounter (INDEPENDENT_AMBULATORY_CARE_PROVIDER_SITE_OTHER): Payer: Self-pay

## 2018-11-24 ENCOUNTER — Other Ambulatory Visit: Payer: BC Managed Care – PPO

## 2018-11-24 ENCOUNTER — Ambulatory Visit: Payer: BC Managed Care – PPO | Admitting: Oncology

## 2019-02-15 ENCOUNTER — Telehealth: Payer: Self-pay

## 2019-02-15 NOTE — Telephone Encounter (Signed)
Called and left voicemail regarding pre-screening questions for appt on 6/23  

## 2019-02-16 ENCOUNTER — Inpatient Hospital Stay (HOSPITAL_BASED_OUTPATIENT_CLINIC_OR_DEPARTMENT_OTHER): Payer: BC Managed Care – PPO | Admitting: Oncology

## 2019-02-16 ENCOUNTER — Encounter: Payer: Self-pay | Admitting: Oncology

## 2019-02-16 ENCOUNTER — Other Ambulatory Visit: Payer: Self-pay

## 2019-02-16 ENCOUNTER — Inpatient Hospital Stay: Payer: BC Managed Care – PPO | Attending: Oncology

## 2019-02-16 ENCOUNTER — Telehealth: Payer: Self-pay | Admitting: Oncology

## 2019-02-16 VITALS — BP 142/96 | HR 80 | Temp 98.2°F | Resp 18 | Wt 383.2 lb

## 2019-02-16 DIAGNOSIS — Z79899 Other long term (current) drug therapy: Secondary | ICD-10-CM | POA: Insufficient documentation

## 2019-02-16 DIAGNOSIS — D649 Anemia, unspecified: Secondary | ICD-10-CM

## 2019-02-16 DIAGNOSIS — D509 Iron deficiency anemia, unspecified: Secondary | ICD-10-CM | POA: Diagnosis present

## 2019-02-16 DIAGNOSIS — D573 Sickle-cell trait: Secondary | ICD-10-CM

## 2019-02-16 LAB — CBC WITH DIFFERENTIAL (CANCER CENTER ONLY)
Abs Immature Granulocytes: 0.03 10*3/uL (ref 0.00–0.07)
Basophils Absolute: 0.1 10*3/uL (ref 0.0–0.1)
Basophils Relative: 1 %
Eosinophils Absolute: 0.2 10*3/uL (ref 0.0–0.5)
Eosinophils Relative: 2 %
HCT: 38 % (ref 36.0–46.0)
Hemoglobin: 12.5 g/dL (ref 12.0–15.0)
Immature Granulocytes: 0 %
Lymphocytes Relative: 28 %
Lymphs Abs: 2 10*3/uL (ref 0.7–4.0)
MCH: 27.1 pg (ref 26.0–34.0)
MCHC: 32.9 g/dL (ref 30.0–36.0)
MCV: 82.3 fL (ref 80.0–100.0)
Monocytes Absolute: 0.5 10*3/uL (ref 0.1–1.0)
Monocytes Relative: 7 %
Neutro Abs: 4.4 10*3/uL (ref 1.7–7.7)
Neutrophils Relative %: 62 %
Platelet Count: 358 10*3/uL (ref 150–400)
RBC: 4.62 MIL/uL (ref 3.87–5.11)
RDW: 12.8 % (ref 11.5–15.5)
WBC Count: 7.2 10*3/uL (ref 4.0–10.5)
nRBC: 0 % (ref 0.0–0.2)

## 2019-02-16 LAB — IRON AND TIBC
Iron: 53 ug/dL (ref 41–142)
Saturation Ratios: 18 % — ABNORMAL LOW (ref 21–57)
TIBC: 296 ug/dL (ref 236–444)
UIBC: 243 ug/dL (ref 120–384)

## 2019-02-16 LAB — FERRITIN: Ferritin: 24 ng/mL (ref 11–307)

## 2019-02-16 NOTE — Telephone Encounter (Signed)
Gave avs and calendar ° °

## 2019-02-16 NOTE — Progress Notes (Signed)
Hematology and Oncology Follow Up Visit  Elizabeth Skinner [id number] June 24, 1981 38 y.o. 02/16/2019 8:47 AM Elizabeth Skinner, MDMcNeill, Elizabeth Butts, MD   Principle Diagnosis: 38 year old woman with iron deficiency anemia diagnosed in October 2019.  She presented with a hemoglobin of 8 and ferritin of 3.  Her anemia is related to chronic menstrual blood losses and poor absorption from gastric bypass.   Prior Therapy: He is status post Feraheme a total of 1020 mg total dose split with 2 weekly infusions completed on August 29, 2018.   Current therapy: Oral iron supplement on a daily basis.  Interim History: Elizabeth Skinner presents today for a follow-up visit.  Since her last visit, she reports no major changes in her health.  She received intravenous iron without complications.  She noted immediate improvement in her energy and performance status.  She noted a decline in her ice cravings.  Continues to have heavy menstrual cycles although she is under evaluation to treat her uterine fibroids.  Continues to take oral iron without any GI complaints.  She does not report any headaches, blurry vision, syncope or seizures. Does not report any fevers, chills or sweats.  Does not report any cough, wheezing or hemoptysis.  Does not report any chest pain, palpitation, orthopnea or leg edema.  Does not report any nausea, vomiting or abdominal pain.  Does not report any constipation or diarrhea.  Does not report any skeletal complaints.    Does not report frequency, urgency or hematuria.  Does not report any skin rashes or lesions. Does not report any heat or cold intolerance.  Does not report any lymphadenopathy or petechiae.  Does not report any anxiety or depression.  Remaining review of systems is negative.    Medications: I have reviewed the patient's current medications.  Current Outpatient Medications  Medication Sig Dispense Refill  . ferrous sulfate 325 (65 FE) MG EC tablet Take 1 tablet (325 mg total) by mouth  daily with breakfast. 90 tablet 3  . Prenatal Vit-Fe Fumarate-FA (PRENATAL MULTIVITAMIN) TABS tablet Take 1 tablet by mouth daily at 12 noon.    . Vitamin D, Ergocalciferol, (DRISDOL) 1.25 MG (50000 UT) CAPS capsule Take 1 capsule (50,000 Units total) by mouth every 7 (seven) days. 4 capsule 0   No current facility-administered medications for this visit.      Allergies: No Known Allergies  Past Medical History, Surgical history, Social history, and Family History were reviewed and updated.    Physical Exam: Blood pressure (!) 142/96, pulse 80, temperature 98.2 F (36.8 C), temperature source Oral, resp. rate 18, weight (!) 383 lb 3.2 oz (173.8 kg), SpO2 99 %.    ECOG:  0    General appearance: Comfortable appearing without any discomfort Head: Normocephalic without any trauma Oropharynx: Mucous membranes are moist and pink without any thrush or ulcers. Eyes: Pupils are equal and round reactive to light. Lymph nodes: No cervical, supraclavicular, inguinal or axillary lymphadenopathy.   Heart:regular rate and rhythm.  S1 and S2 without leg edema. Lung: Clear without any rhonchi or wheezes.  No dullness to percussion. Abdomin: Soft, nontender, nondistended with good bowel sounds.  No hepatosplenomegaly. Musculoskeletal: No joint deformity or effusion.  Full range of motion noted. Neurological: No deficits noted on motor, sensory and deep tendon reflex exam. Skin: No petechial rash or dryness.  Appeared moist.     Lab Results: Lab Results  Component Value Date   WBC 7.5 08/11/2018   HGB 8.3 (L) 08/11/2018   HCT 28.6 (L)  08/11/2018   MCV 64.4 (L) 08/11/2018   PLT 594 (H) 08/11/2018     Chemistry      Component Value Date/Time   NA 138 09/24/2018 1426   K 4.7 09/24/2018 1426   CL 99 09/24/2018 1426   CO2 25 09/24/2018 1426   BUN 11 09/24/2018 1426   CREATININE 0.93 09/24/2018 1426      Component Value Date/Time   CALCIUM 9.3 09/24/2018 1426   ALKPHOS 81  09/24/2018 1426   AST 14 09/24/2018 1426   ALT 15 09/24/2018 1426   BILITOT 0.3 09/24/2018 1426      Impression and Plan:  38 year old woman with:  1.    Anemia diagnosed in October 2019.  She was found to have iron deficiency related to chronic menstrual blood losses and poor absorption from gastric bypass.  He is status post Feraheme infusion without any major complications completed in January 2020.  The natural course of the disease and treatment options in the future were reviewed including oral iron versus intermittent IV iron if needed in the future.  Her hemoglobin today has normalized after intravenous iron and she is asymptomatic.  At this time I recommended continuing oral iron supplement for maintenance purposes.  We will continue to check on her periodically and repeat intravenous iron if needed.  2.  Sickle cell trait: No intervention or treatment needed at this time.  She has not presented with any crises in the past.  Her macrocytosis is predominantly related to iron deficiency and no evidence of anemia at this time noted after her iron stores have been corrected.  3.  Follow-up: We will be in 6 months for repeat evaluation.  15  minutes was spent with the patient face-to-face today.  More than 50% of time was spent on reviewing her disease status, reviewing laboratory data, treatment options answering questions regarding future plan of care.     Elizabeth Button, MD 6/23/20208:47 AM

## 2019-02-23 ENCOUNTER — Ambulatory Visit: Payer: BC Managed Care – PPO | Admitting: Oncology

## 2019-02-23 ENCOUNTER — Other Ambulatory Visit: Payer: BC Managed Care – PPO

## 2019-05-21 ENCOUNTER — Other Ambulatory Visit: Payer: Self-pay | Admitting: Obstetrics and Gynecology

## 2019-05-27 ENCOUNTER — Other Ambulatory Visit: Payer: Self-pay | Admitting: Obstetrics and Gynecology

## 2019-05-27 DIAGNOSIS — D259 Leiomyoma of uterus, unspecified: Secondary | ICD-10-CM

## 2019-06-01 ENCOUNTER — Other Ambulatory Visit: Payer: Self-pay | Admitting: Interventional Radiology

## 2019-06-01 ENCOUNTER — Encounter: Payer: Self-pay | Admitting: *Deleted

## 2019-06-01 ENCOUNTER — Inpatient Hospital Stay
Admission: RE | Admit: 2019-06-01 | Discharge: 2019-06-01 | Disposition: A | Discharge status: Home / Self Care | Payer: BC Managed Care – PPO | Source: Ambulatory Visit | Attending: Obstetrics and Gynecology | Admitting: Obstetrics and Gynecology

## 2019-06-01 ENCOUNTER — Other Ambulatory Visit: Payer: Self-pay | Admitting: Obstetrics and Gynecology

## 2019-06-01 DIAGNOSIS — D259 Leiomyoma of uterus, unspecified: Secondary | ICD-10-CM

## 2019-06-01 HISTORY — PX: IR RADIOLOGIST EVAL & MGMT: IMG5224

## 2019-06-01 NOTE — Consult Note (Signed)
Chief Complaint: Patient was consulted remotely today (TeleHealth) for symptomatic uterine fibroids At the request of Cousins,Sheronette.    Referring Physician(s): Cousins,Sheronette  History of Present Illness: Elizabeth Skinner is a 38 y.o. female who was diagnosed with uterine fibroids in the fall 2019.  She was noted to be markedly anemic and was evaluated by Dr. Alen Blew.  She responded nicely to Feraheme infusion.  Low in the macular rash better okay well appreciate the heads up she noted heavy  menstrual periods lasting up to 5 days.  More recently she has noted some inter-period Bleeding. No previous fibroid surgeries.  She is G0, P0 with desire to maintain fertility.  Past Medical History:  Diagnosis Date  . Anemia   . Fibroids   . Iron deficiency   . Pre-diabetes   Sickle cell trait Elevated A1c  Past Surgical History:  Procedure Laterality Date  . CHOLECYSTECTOMY    Gastric bypass surgery 2007  Allergies: Patient has no known allergies.  Medications: Prior to Admission medications   Medication Sig Start Date End Date Taking? Authorizing Provider  BIOTIN PO Take by mouth daily.     [provider]  CYANOCOBALAMIN PO Take by mouth daily.     [provider]  ferrous sulfate 325 (65 FE) MG EC tablet Take 1 tablet (325 mg total) by mouth daily with breakfast. 08/11/18   Wyatt Portela, MD  Prenatal Vit-Fe Fumarate-FA (PRENATAL MULTIVITAMIN) TABS tablet Take 1 tablet by mouth daily at 12 noon.    [provider]  Vitamin D, Ergocalciferol, (DRISDOL) 1.25 MG (50000 UT) CAPS capsule Take 1 capsule (50,000 Units total) by mouth every 7 (seven) days. 10/12/18   Georgia Lopes, DO     Family History  Problem Relation Age of Onset  . Diabetes Mother   . Obesity Mother     Social History   Socioeconomic History  . Marital status: Single    Spouse name: Not on file  . Number of children: Not on file  . Years of education: Not on file  .  Highest education level: Not on file  Occupational History  . 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 Financial resource strain: Not on file  . Food insecurity    Worry: Not on file    Inability: Not on file  . Transportation needs    Medical: Not on file    Non-medical: Not on file  Tobacco Use  . Smoking status: Never Smoker  . Smokeless tobacco: Never Used  Substance and Sexual Activity  . Alcohol use: Not on file  . Drug use: Not on file  . Sexual activity: Not on file  Lifestyle  . Physical activity    Days per week: Not on file    Minutes per session: Not on file  . Stress: Not on file  Relationships  . Social Herbalist on phone: Not on file    Gets together: Not on file    Attends religious service: Not on file    Active member of club or organization: Not on file    Attends meetings of clubs or organizations: Not on file    Relationship status: Not on file  Other Topics Concern  . Not on file  Social History Narrative  . Not on file  She is a professor at [employer]  ECOG Status: 1 - Symptomatic but completely ambulatory  Review of Systems  Review of Systems: A 12 point ROS  discussed and pertinent positives are indicated in the HPI above.  All other systems are negative.  Physical Exam No direct physical exam was performed (except for noted visual exam findings with Video Visits).    Vital Signs: There were no vitals taken for this visit.  Imaging: Ultrasound at [facility] office describes multiple uterine fibroids measuring up to 5.8 cm, not available for direct review Labs: Pap smear 06/24/2018 - CBC: Recent Labs    08/11/18 1437 02/16/19 0841  WBC 7.5 7.2  HGB 8.3* 12.5  HCT 28.6* 38.0  PLT 594* 358    COAGS: No results for input(s): INR, APTT in the last 8760 hours.  BMP: Recent Labs    09/24/18 1426  NA 138  K 4.7  CL 99  CO2 25  GLUCOSE 83  BUN 11  CALCIUM 9.3  CREATININE 0.93  GFRNONAA 78  GFRAA 90    LIVER  FUNCTION TESTS: Recent Labs    09/24/18 1426  BILITOT 0.3  AST 14  ALT 15  ALKPHOS 81  PROT 7.2  ALBUMIN 4.0    TUMOR MARKERS: No results for input(s): AFPTM, CEA, CA199, CHROMGRNA in the last 8760 hours.  Assessment and Plan:  My impression is that this patient's menorrhagia is  likely secondary to uterine fibroids. We spent the majority of the consultation discussing the pathophysiology of uterine leiomyomata, natural history, anticipated  involution post menopause, and treatment options. We discussed myomectomy, hysterectomy, and uterine fibroid embolization. I described the technique of UFE, anticipated benefits, possible risks and complications including but not limited to bleeding, infection, vessel damage, nontarget embolization, and incomplete symptom relief. We discussed the 90% clinical success rate historically and at our experience with UFE. We discussed the post procedure course and time course of symptom resolution. We discussed the need for continued gynecologic care.  We discussed that there is a long history of patient's having normal full-term pregnancies post uterine artery ligation and embolization, which is not considered a contraindication or risk factor.  We discussed that in the very unlikely event of treated fibroid superinfection which was not cured with antibiotics, an urgent hysterectomy could be required but risk of this is less than 1%, although would obviously adversely affect fertility.  She seemed to understand and did ask appropriate questions, which were answered.  Based on her evaluation thus far, I think she would be an appropriate candidate for uterine fibroid embolization because of her symptomatology and  uterine fibroids. To complete her evaluation and workup, I would require pelvic MRI with contrast to best determine the exact site of location of her uterine fibroids, specifically to exclude any pedunculated fibroids on a narrow stalk, as well as to  exclude any unexpected pelvic pathology.   After our discussion, the patient was motivated proceed. Accordingly, we can set up the MRI   at her convenience as an outpatient. If this looks okay, she would like to proceed with UFE, probably after Thanksgiving.  Thank you for this interesting consult.  I greatly enjoyed meeting Kinzli Tauzin and look forward to participating in their care.  A copy of this report was sent to the requesting provider on this date.  Electronically Signed: Rickard Rhymes 06/01/2019, 3:45 PM   I spent a total of  40 Minutes   in remote  clinical consultation, greater than 50% of which was counseling/coordinating care for symptomatic uterine fibroids  Visit type: Audio and video Midwife connection).   Alternative for in-person consultation at [facility], [address], [place], [place]. This visit type was conducted due to national recommendations for restrictions regarding the COVID-19 Pandemic (e.g. social distancing).  This format is felt to be most appropriate for this patient at this time.  All issues noted in this document were discussed and addressed.

## 2019-06-22 ENCOUNTER — Other Ambulatory Visit: Payer: Self-pay | Admitting: Obstetrics and Gynecology

## 2019-06-25 ENCOUNTER — Other Ambulatory Visit: Payer: Self-pay

## 2019-06-25 ENCOUNTER — Ambulatory Visit
Admission: RE | Admit: 2019-06-25 | Discharge: 2019-06-25 | Disposition: A | Discharge status: Home / Self Care | Payer: BC Managed Care – PPO | Source: Ambulatory Visit | Attending: Obstetrics and Gynecology | Admitting: Obstetrics and Gynecology

## 2019-06-25 DIAGNOSIS — D259 Leiomyoma of uterus, unspecified: Secondary | ICD-10-CM

## 2019-06-25 IMAGING — MR MR PELVIS WO/W CM
7 of 14 series · 26 of 48 positions shown · IV contrast (20 ML MULTIHANCE)
Comparison: None.

CLINICAL DATA: Symptomatic uterine fibroids. Pre uterine artery
embolization.

EXAM:
MRI PELVIS WITHOUT AND WITH CONTRAST
TECHNIQUE: Multiplanar multisequence MR imaging of the pelvis was performed
both before and after administration of intravenous contrast.
CONTRAST:  20mL MULTIHANCE GADOBENATE DIMEGLUMINE 529 MG/ML IV SOLN

[Series 3: T2 · coronal · 5.0mm · 1.56mm/px · 2 of 20 slices shown]
[im 1/20]
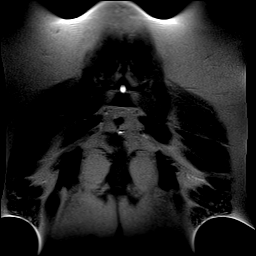
[im 20/20]
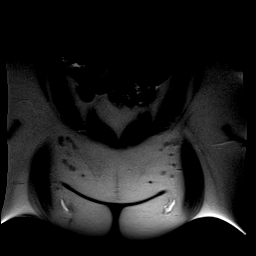

[Series 4: t2_tse_sag · sagittal · 5.0mm · 0.51mm/px · 4 of 25 slices shown]
[im 1/25]
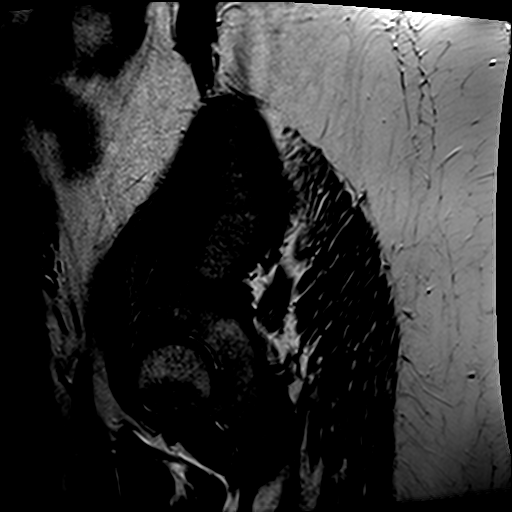
[im 9/25]
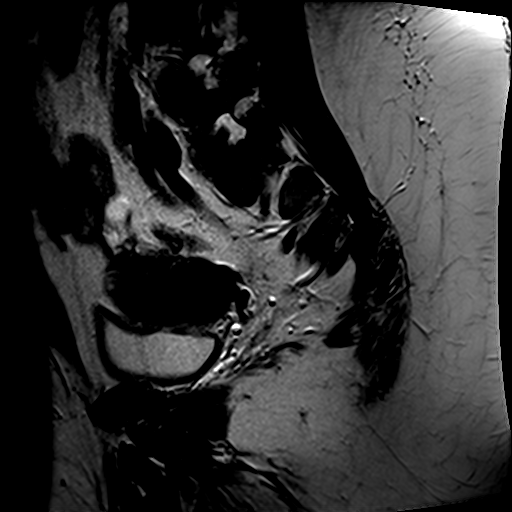
[im 17/25]
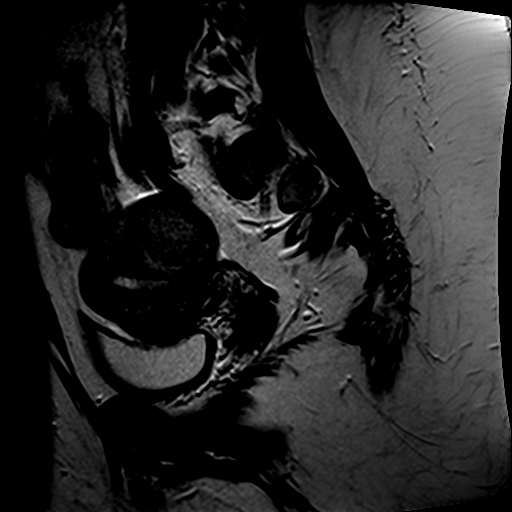
[im 25/25]
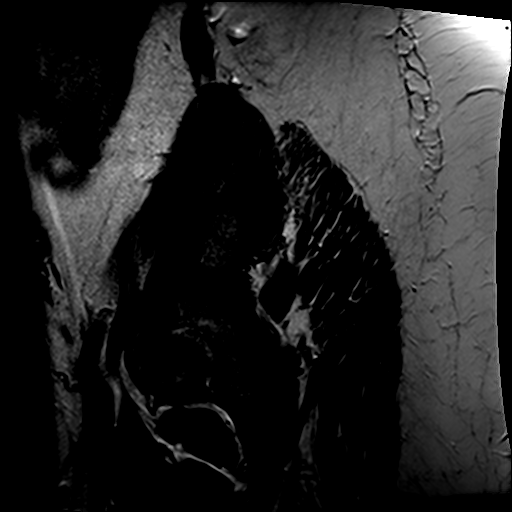

[Series 5: t2_tse axial · axial · 5.0mm · 0.59mm/px · z∈[-70,+86]mm · 4 of 27 slices shown]
[im 1/27]
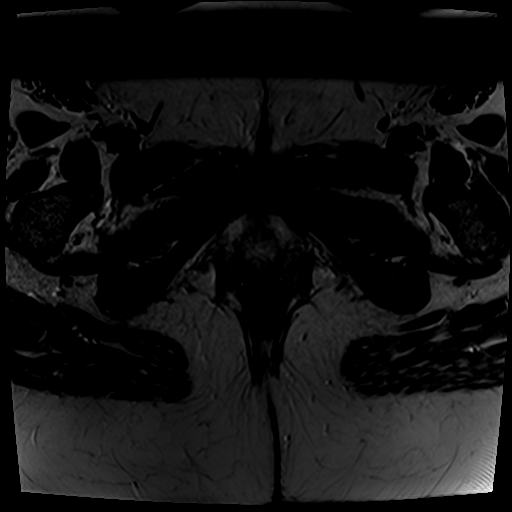
[im 9/27]
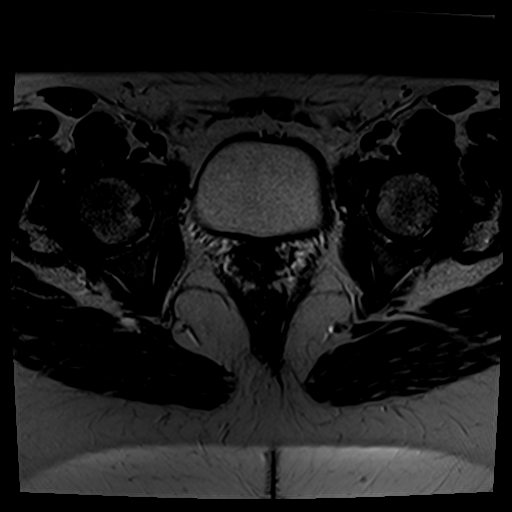
[im 18/27]
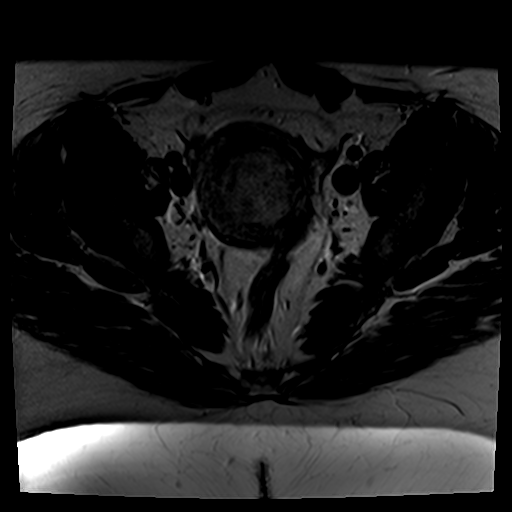
[im 27/27]
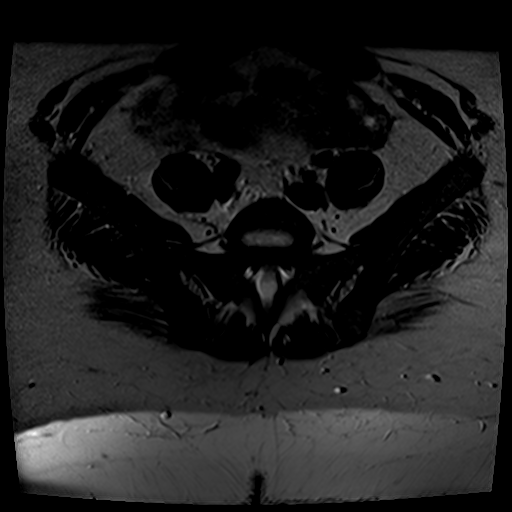

[Series 6: t2_tse axial fs · axial · 5.0mm · 0.59mm/px · z∈[-70,+86]mm · 4 of 27 slices shown]
[im 1/27]
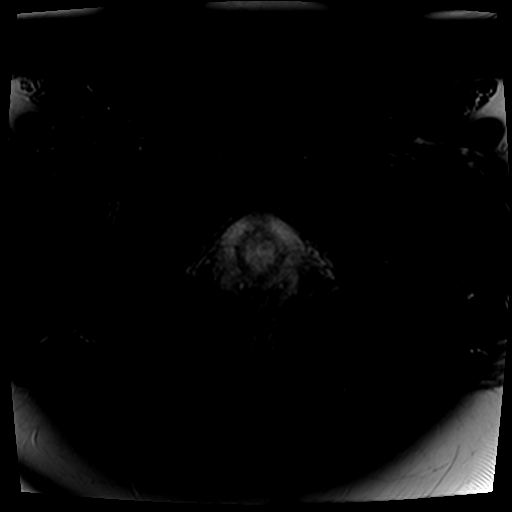
[im 9/27]
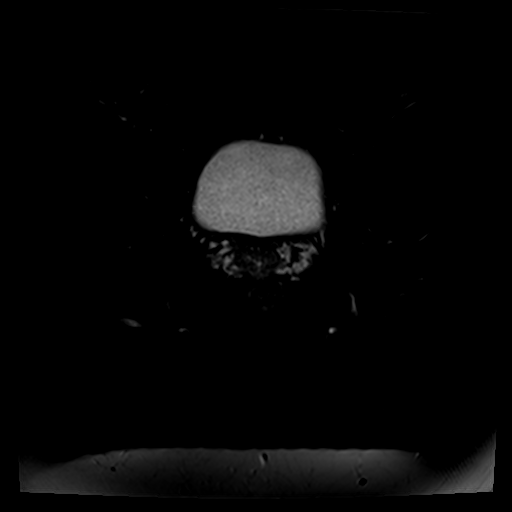
[im 18/27]
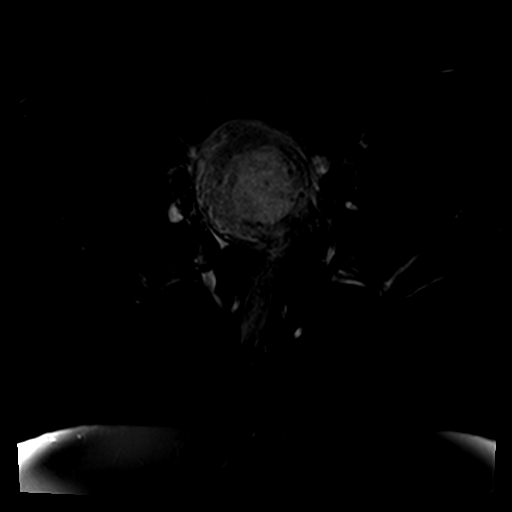
[im 27/27]
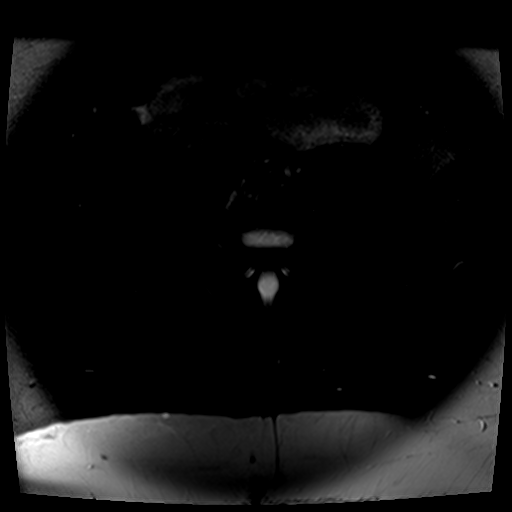

[Series 7: axial spgr no · axial · 5.0mm · 0.59mm/px · z∈[-70,+86]mm · 4 of 27 slices shown]
[im 1/27]
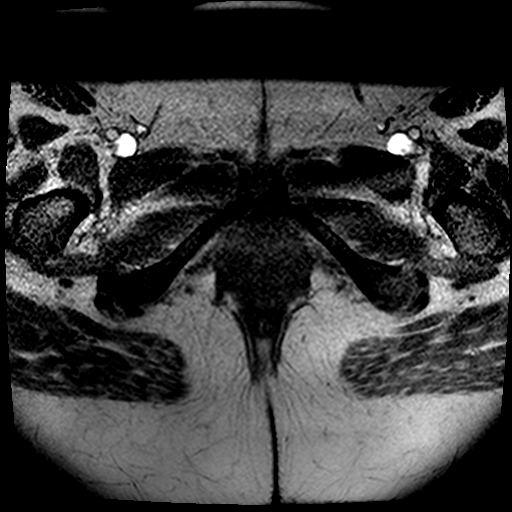
[im 9/27]
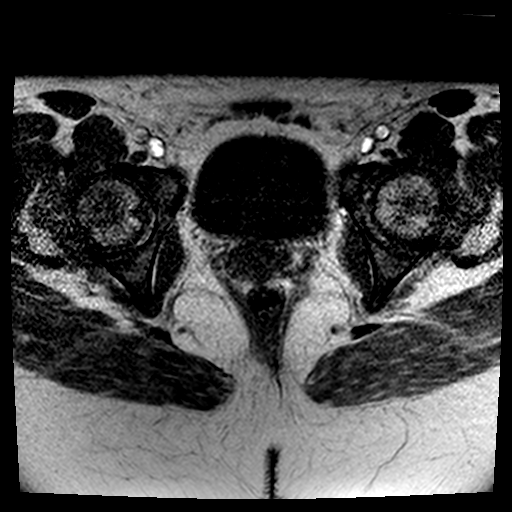
[im 18/27]
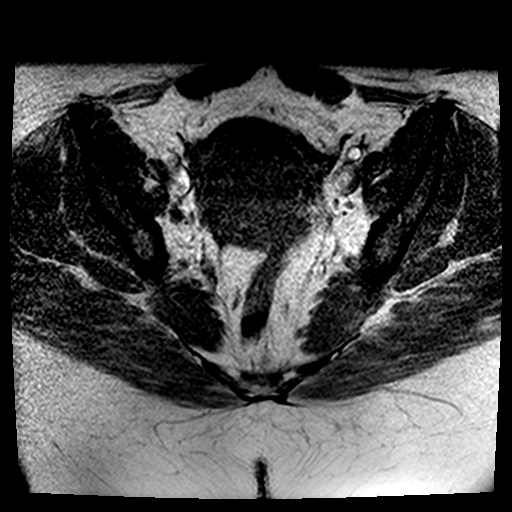
[im 27/27]
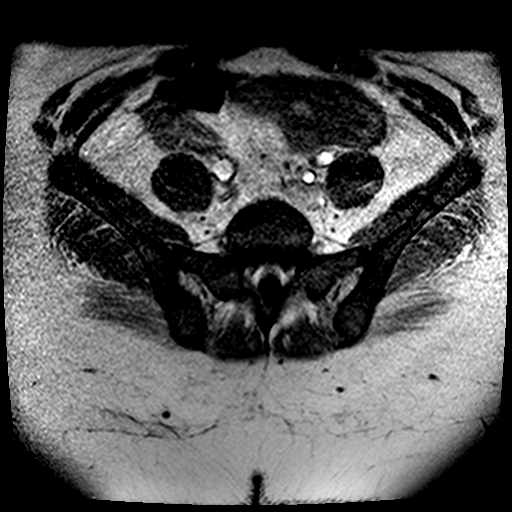

[Series 8: axial spgr fs · axial · non-contrast · 5.0mm · 1.17mm/px · z∈[-73,+89]mm · 4 of 27 slices shown]
[im 1/27]
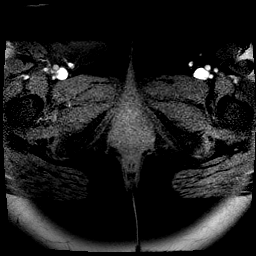
[im 9/27]
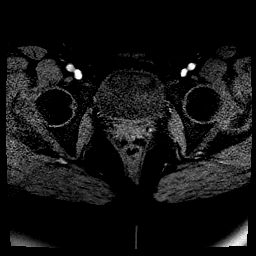
[im 18/27]
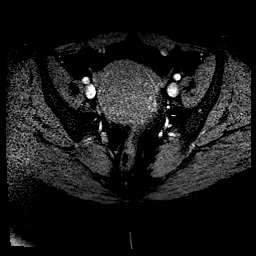
[im 27/27]
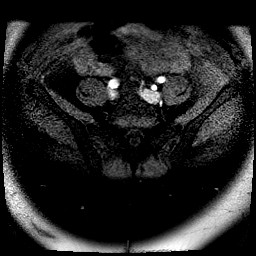

[Series 9: axial spgr post · axial · 5.0mm · 1.17mm/px · z∈[-73,+89]mm · 4 of 27 slices shown]
[im 1/27]
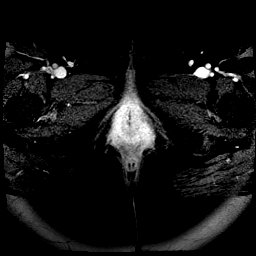
[im 9/27]
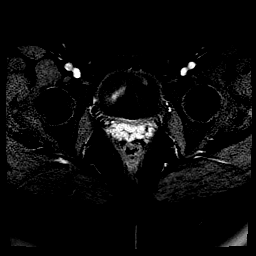
[im 18/27]
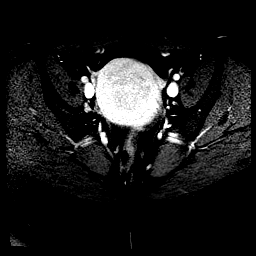
[im 27/27]
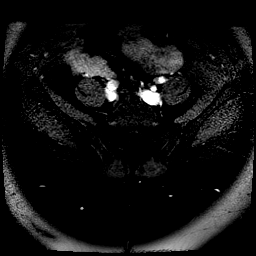

[26 of 48 positions shown; findings below may reference images not displayed]

FINDINGS: Urinary Tract: The bladder is unremarkable. No mass or bladder
calculi.

Bowel:  The visualized colon and small bowel loops are unremarkable.

Vascular/Lymphatic: Normal appearing pelvic vasculature. No pelvic
adenopathy the.

Reproductive: Enlarged fibroid uterus. The uterus measures 10.5 x
7.8 x 7.6 cm with near in volume of 311 cubic cm.

The largest fibroid is in the posterior myometrium and measures
x 4.5 x 4.8 cm with volume of 60 cubic cm. This fibroid demonstrates
slight increased T2 signal intensity suggesting mucinous
degeneration. There is mild mass effect on the endometrium by this
fibroid but the endometrial cavity appears normal. The junctional
zone appears normal. Multiple other fibroids demonstrate typical low
T2 signal intensity. All of the fibroids demonstrate some degree of
enhancement after contrast administration.

The cervix is unremarkable.

Both ovaries are normal.

Other: No free pelvic fluid collections. No inguinal mass or
adenopathy.

Musculoskeletal: The bony structures are unremarkable.
IMPRESSION: 1. Enlarged fibroid uterus with the largest fibroid demonstrating
probable mucinous degeneration. Mild mass effect on the endometrial
cavity but no serosal or submucosal fibroids. All of the fibroids
demonstrate some degree of enhancement after contrast
administration.
2. Normal ovaries.

## 2019-06-25 MED ORDER — GADOBENATE DIMEGLUMINE 529 MG/ML IV SOLN
20.0000 mL | Freq: Once | INTRAVENOUS | Status: AC | PRN
  Administered 2019-06-25: 10:00:00 20 mL via INTRAVENOUS

## 2019-07-12 ENCOUNTER — Other Ambulatory Visit (HOSPITAL_COMMUNITY): Payer: Self-pay | Admitting: Interventional Radiology

## 2019-07-12 DIAGNOSIS — D259 Leiomyoma of uterus, unspecified: Secondary | ICD-10-CM

## 2019-08-02 ENCOUNTER — Telehealth: Payer: Self-pay | Admitting: Oncology

## 2019-08-02 NOTE — Telephone Encounter (Signed)
Returned patient's phone call regarding rescheduling 12/23 appointment, per patient's request appointment has moved to 01/14.

## 2019-08-06 ENCOUNTER — Other Ambulatory Visit (HOSPITAL_COMMUNITY): Payer: BC Managed Care – PPO

## 2019-08-06 ENCOUNTER — Other Ambulatory Visit (HOSPITAL_COMMUNITY)
Admission: RE | Admit: 2019-08-06 | Discharge: 2019-08-06 | Disposition: A | Discharge status: Home / Self Care | Payer: BC Managed Care – PPO | Source: Ambulatory Visit | Attending: Interventional Radiology | Admitting: Interventional Radiology

## 2019-08-06 DIAGNOSIS — Z20828 Contact with and (suspected) exposure to other viral communicable diseases: Secondary | ICD-10-CM | POA: Diagnosis not present

## 2019-08-06 DIAGNOSIS — Z01812 Encounter for preprocedural laboratory examination: Secondary | ICD-10-CM | POA: Diagnosis not present

## 2019-08-07 LAB — NOVEL CORONAVIRUS, NAA (HOSP ORDER, SEND-OUT TO REF LAB; TAT 18-24 HRS): SARS-CoV-2, NAA: NOT DETECTED

## 2019-08-10 ENCOUNTER — Other Ambulatory Visit: Payer: Self-pay | Admitting: Physician Assistant

## 2019-08-11 ENCOUNTER — Encounter (HOSPITAL_COMMUNITY): Payer: Self-pay

## 2019-08-11 ENCOUNTER — Ambulatory Visit (HOSPITAL_COMMUNITY)
Admission: RE | Admit: 2019-08-11 | Discharge: 2019-08-12 | Disposition: A | Discharge status: Home / Self Care | Payer: BC Managed Care – PPO | Source: Ambulatory Visit | Attending: Interventional Radiology | Admitting: Interventional Radiology

## 2019-08-11 ENCOUNTER — Ambulatory Visit (HOSPITAL_COMMUNITY)
Admission: RE | Admit: 2019-08-11 | Discharge: 2019-08-11 | Disposition: A | Discharge status: Home / Self Care | Payer: BC Managed Care – PPO | Source: Ambulatory Visit | Attending: Interventional Radiology | Admitting: Interventional Radiology

## 2019-08-11 ENCOUNTER — Other Ambulatory Visit (HOSPITAL_COMMUNITY): Payer: Self-pay | Admitting: Interventional Radiology

## 2019-08-11 ENCOUNTER — Other Ambulatory Visit: Payer: Self-pay

## 2019-08-11 DIAGNOSIS — D259 Leiomyoma of uterus, unspecified: Secondary | ICD-10-CM | POA: Diagnosis present

## 2019-08-11 DIAGNOSIS — D219 Benign neoplasm of connective and other soft tissue, unspecified: Secondary | ICD-10-CM | POA: Diagnosis present

## 2019-08-11 HISTORY — PX: IR US GUIDE VASC ACCESS LEFT: IMG2389

## 2019-08-11 HISTORY — PX: IR ANGIOGRAM PELVIS SELECTIVE OR SUPRASELECTIVE: IMG661

## 2019-08-11 HISTORY — PX: IR ANGIOGRAM SELECTIVE EACH ADDITIONAL VESSEL: IMG667

## 2019-08-11 HISTORY — PX: IR EMBO TUMOR ORGAN ISCHEMIA INFARCT INC GUIDE ROADMAPPING: IMG5449

## 2019-08-11 LAB — CBC WITH DIFFERENTIAL/PLATELET
Abs Immature Granulocytes: 0.03 10*3/uL (ref 0.00–0.07)
Basophils Absolute: 0.1 10*3/uL (ref 0.0–0.1)
Basophils Relative: 1 %
Eosinophils Absolute: 0.2 10*3/uL (ref 0.0–0.5)
Eosinophils Relative: 2 %
HCT: 40.7 % (ref 36.0–46.0)
Hemoglobin: 13.1 g/dL (ref 12.0–15.0)
Immature Granulocytes: 0 %
Lymphocytes Relative: 29 %
Lymphs Abs: 2 10*3/uL (ref 0.7–4.0)
MCH: 26.5 pg (ref 26.0–34.0)
MCHC: 32.2 g/dL (ref 30.0–36.0)
MCV: 82.4 fL (ref 80.0–100.0)
Monocytes Absolute: 0.4 10*3/uL (ref 0.1–1.0)
Monocytes Relative: 6 %
Neutro Abs: 4.2 10*3/uL (ref 1.7–7.7)
Neutrophils Relative %: 62 %
Platelets: 411 10*3/uL — ABNORMAL HIGH (ref 150–400)
RBC: 4.94 MIL/uL (ref 3.87–5.11)
RDW: 12.8 % (ref 11.5–15.5)
WBC: 6.8 10*3/uL (ref 4.0–10.5)
nRBC: 0 % (ref 0.0–0.2)

## 2019-08-11 LAB — PROTIME-INR
INR: 1 (ref 0.8–1.2)
Prothrombin Time: 12.6 seconds (ref 11.4–15.2)

## 2019-08-11 LAB — BASIC METABOLIC PANEL
Anion gap: 10 (ref 5–15)
BUN: 9 mg/dL (ref 6–20)
CO2: 24 mmol/L (ref 22–32)
Calcium: 8.9 mg/dL (ref 8.9–10.3)
Chloride: 104 mmol/L (ref 98–111)
Creatinine, Ser: 0.94 mg/dL (ref 0.44–1.00)
GFR calc Af Amer: >60 mL/min (ref >60)
GFR calc non Af Amer: >60 mL/min (ref >60)
Glucose, Bld: 117 mg/dL — ABNORMAL HIGH (ref 70–99)
Potassium: 4.8 mmol/L (ref 3.5–5.1)
Sodium: 138 mmol/L (ref 135–145)

## 2019-08-11 LAB — HCG, SERUM, QUALITATIVE: Preg, Serum: NEGATIVE

## 2019-08-11 IMAGING — XA IR EMBO TUMOR ORGAN ISCHEMIA INFARCT INC GUIDE ROADMAPPING
13 of 18 series · 13 of 24 positions shown · IV contrast (IODINE)
Comparison: none

CLINICAL DATA: Symptomatic uterine fibroids if

EXAM:
EXAM
BILATERAL UTERINE ARTERY EMBOLIZATION
TECHNIQUE: The procedure, risks, benefits, and alternatives were explained to
the patient. Questions regarding the procedure were encouraged and
answered. The patient understands and consents to the procedure. As
antibiotic prophylaxis, cefazolin 2 g was ordered pre-procedure and
administered intravenously within one hour of incision.

[Series 1: care body 4 · 1 of 18 frames shown (1 of 11)]
[frame 3/18]
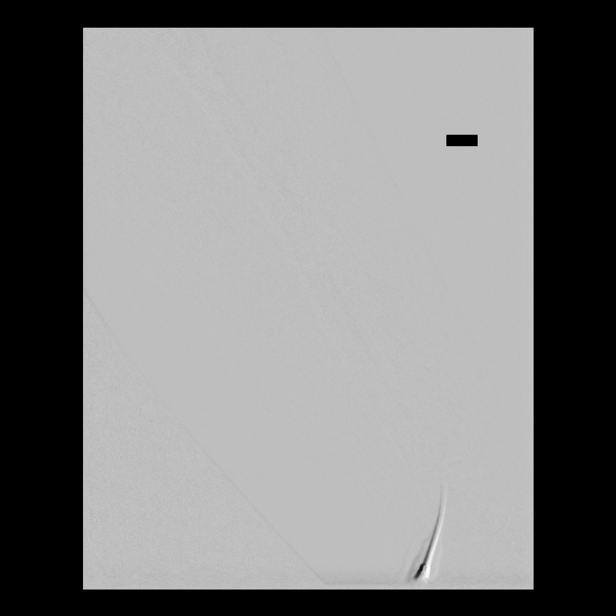

[Series 2: care body 4 · 1 of 16 frames shown (2 of 11)]
[frame 1/16]
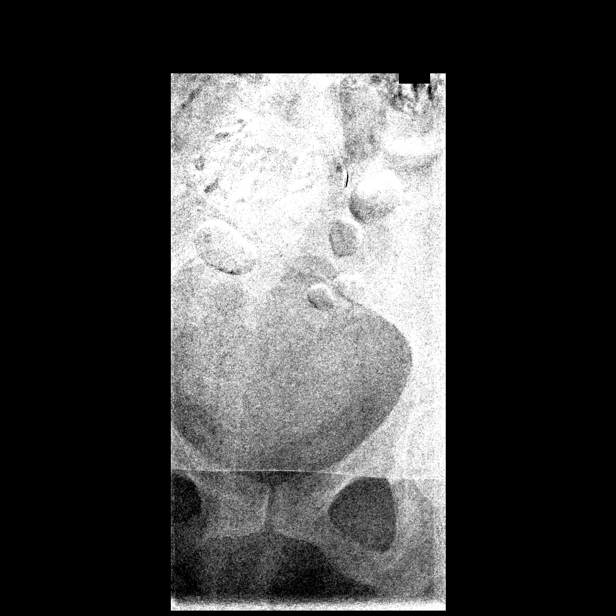

[Series 3: care body 4 · 1 of 17 frames shown (3 of 11)]
[frame 15/17]
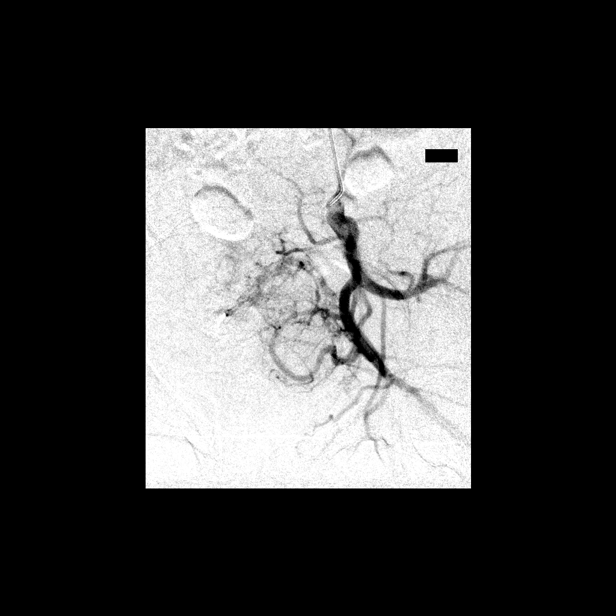

[Series 5: care body 4 · 1 of 16 frames shown (4 of 11)]
[frame 9/16]
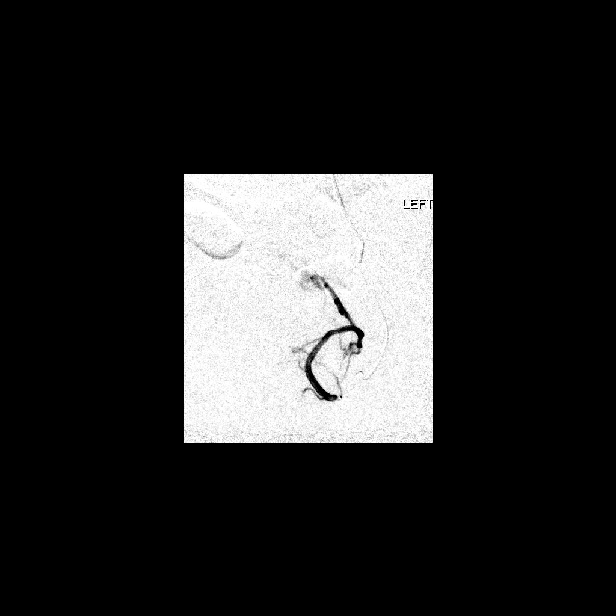

[Series 6: care body 4 · 1 of 32 frames shown (5 of 11)]
[frame 28/32]
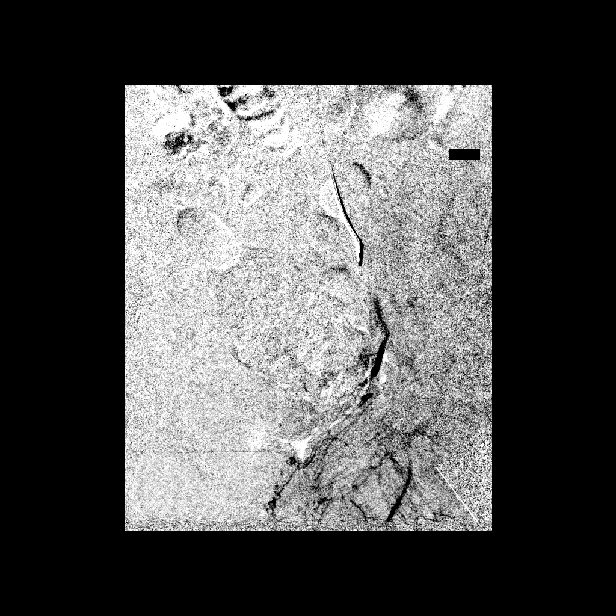

[Series 8: care body 4 · 1 of 17 frames shown (6 of 11)]
[frame 15/17]
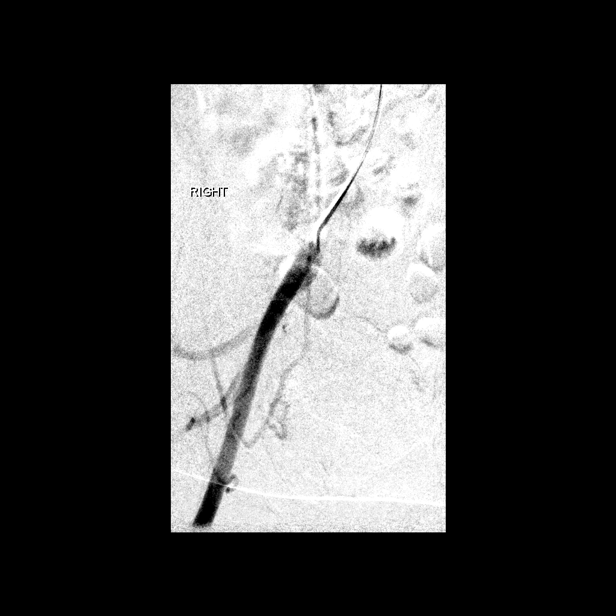

[Series 10: care body 4 · 1 of 17 frames shown (7 of 11)]
[frame 9/17]
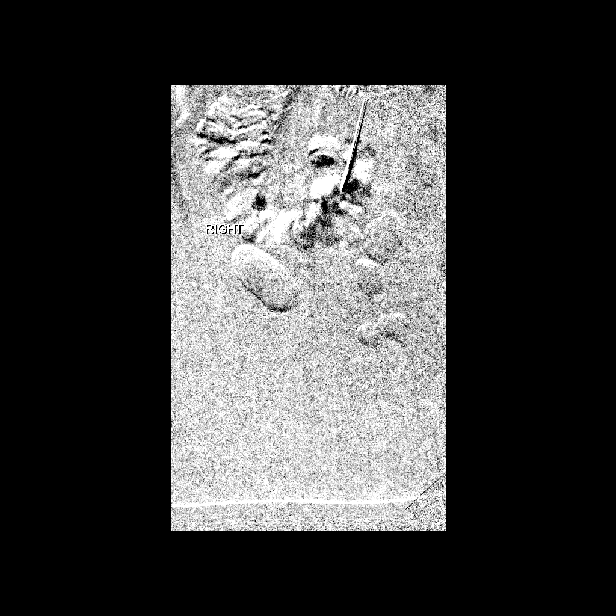

[Series 12: care body 4 · 1 of 16 frames shown (8 of 11)]
[frame 3/16]
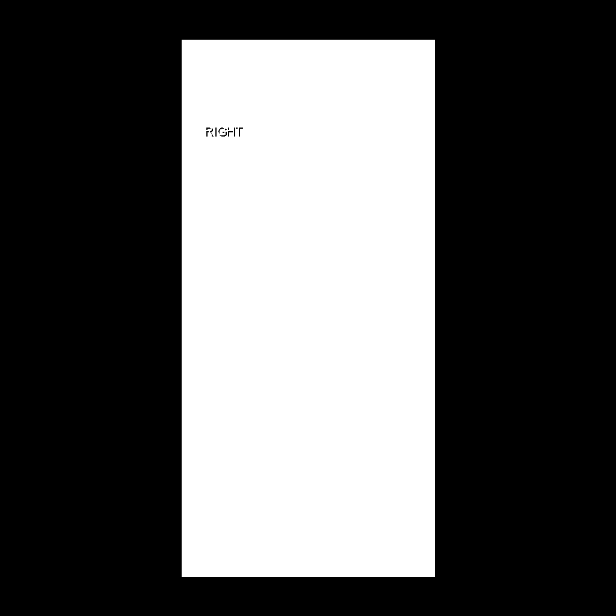

[Series 13: care body 4 · 1 of 26 frames shown (9 of 11)]
[frame 22/26]
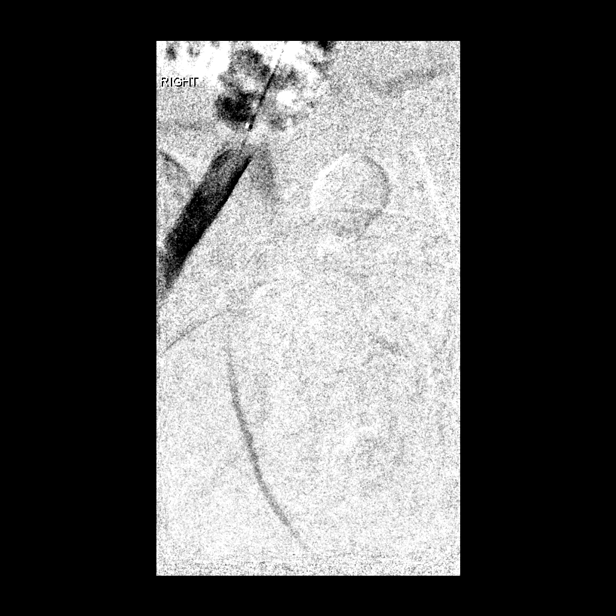

[Series 15: care body 4 · 1 of 26 frames shown (10 of 11)]
[frame 4/26]
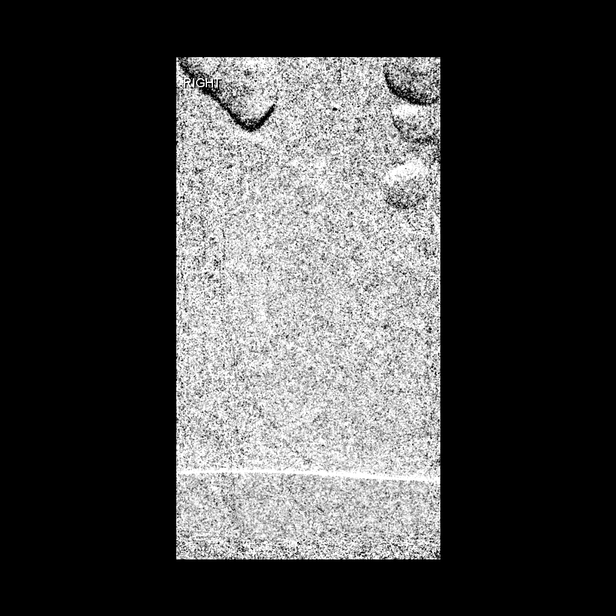

[Series 16: care body 4 · 1 of 23 frames shown (11 of 11)]
[frame 20/23]
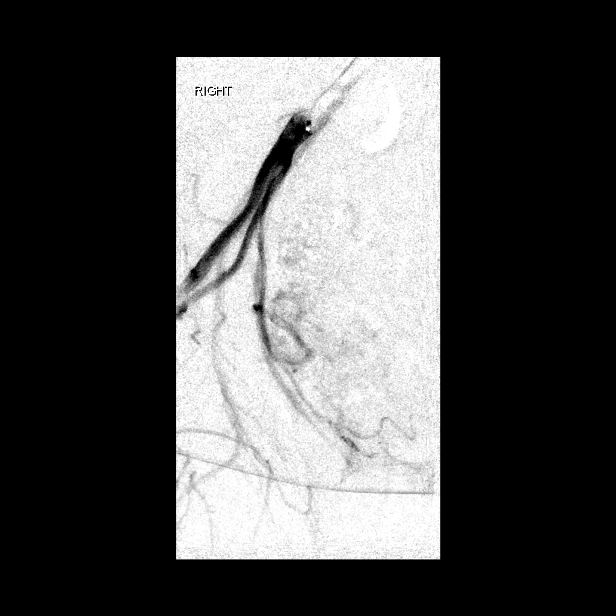

[Series 18: fl - angio · 1 of 42 frames shown]
[frame 32/42]
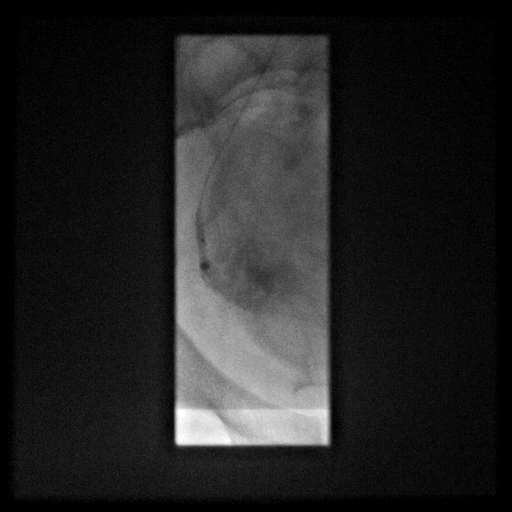

[Series 300: ld dsa body · 1 of 16 slices shown]
[im 16/16]
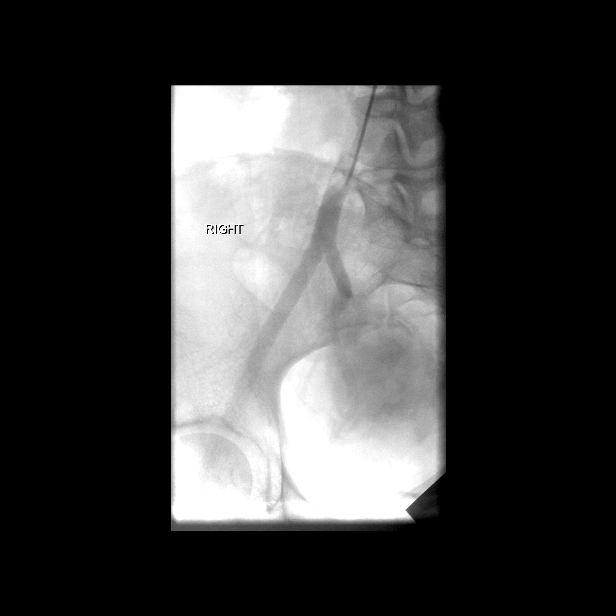

[13 of 24 positions shown; findings below may reference images not displayed]

Left radial artery entry site was determined under ultrasound. Skin
site was marked, prepped with chlorhexidine, and draped in usual
sterile fashion, and infiltrated locally with 1% lidocaine.

Intravenous Fentanyl [LR] and Versed 6mg were administered as
conscious sedation during continuous monitoring of the patient's
level of consciousness and physiological / cardiorespiratory status
by the radiology RN, with a total moderate sedation time of 104
minutes.

Under real-time ultrasound guidance, the left radial artery was
accessed at the wrist with a micropuncture needle sheath set using
singlewall technique in a single pass. Ultrasound imaging
documentation was saved. Intra-arterial
nitroglycerin/verapamil/heparin cocktail administered. Coaxial 5
French angled angiographic catheter was used to selectively
catheterize the left internal iliac artery for pelvic arteriography.
A coaxial microcatheter was advanced with the 016 wire and used to
selectively catheterize the left uterine artery. The microcatheter
tip was positioned in the distal horizontal segment. Selective
arteriogram confirms appropriate positioning. Distal branches of the
left uterine artery were embolized with 500-700 micron Embospheres.
Embolization continued until near stasis of flow was achieved.
Microcatheter was withdrawn and a followup selective left iliac
arteriogram was obtained.

Attention was then directed to the right. The angiographic catheter
would not advance easily distally into the internal iliac artery,
attributed to spasm. This did not respond to repeat bolus of
verapamil and nitroglycerin intra arterial through the sheath.
Catheter exchanged for a 4 French glide catheter which similarly
would not advance adequately distally. This was exchanged for a 4
French 100 cm glide C2 which was advanced to the proximal abdominal
aorta, and the coaxial 150 cm microcatheter with 016 wire advanced
and used to selectively catheterize the right uterine artery.
Confirmatory arteriogram was performed. Right uterine artery
branches were embolized with 500-700 micron Embospheres to near
stasis of flow.

A total of 4 vials of Embospheres were utilized for the case.
Microcatheter and C2 catheter removed. After protocol intra arterial
nitroglycerin/verapamil bolus, sheath was removed and hemostasis
achieved radial band. Patient tolerated the procedure well.

FLUOROSCOPY TIME:  25.7 minute; [LR] mGy

COMPLICATIONS:
COMPLICATIONS
none
IMPRESSION: 1. Technically successful bilateral uterine artery embolization
using 500-700 micron Embospheres.

## 2019-08-11 IMAGING — XA IR ANGIO/ADD [PERSON_NAME]
13 of 18 series · 13 of 24 positions shown · IV contrast (IODINE)
Comparison: none

CLINICAL DATA: Symptomatic uterine fibroids if

EXAM:
EXAM
BILATERAL UTERINE ARTERY EMBOLIZATION
TECHNIQUE: The procedure, risks, benefits, and alternatives were explained to
the patient. Questions regarding the procedure were encouraged and
answered. The patient understands and consents to the procedure. As
antibiotic prophylaxis, cefazolin 2 g was ordered pre-procedure and
administered intravenously within one hour of incision.

[Series 1: (id) · 1 of 3 slices shown]
[im 1/3]
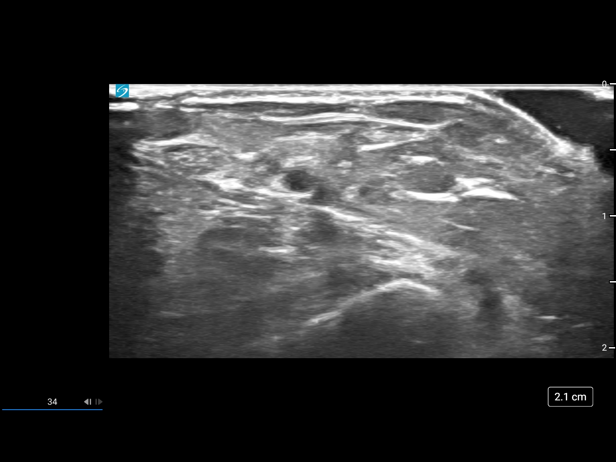

[Series 2: care body 4 · 1 of 16 frames shown (1 of 11)]
[frame 1/16]
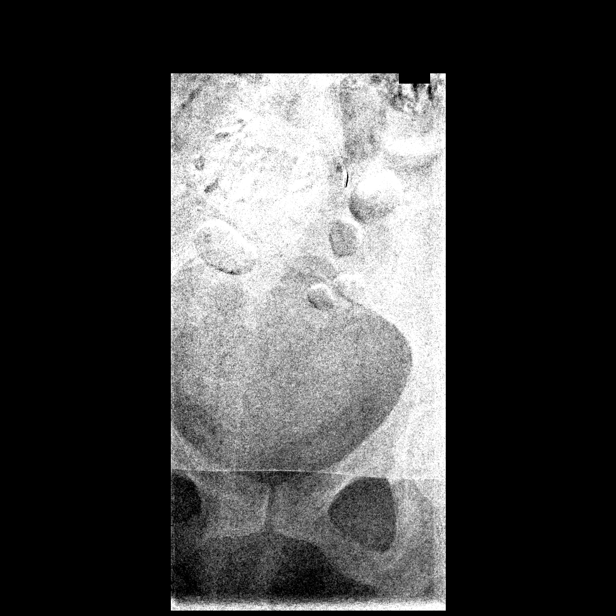

[Series 3: care body 4 · 1 of 17 frames shown (2 of 11)]
[frame 14/17]
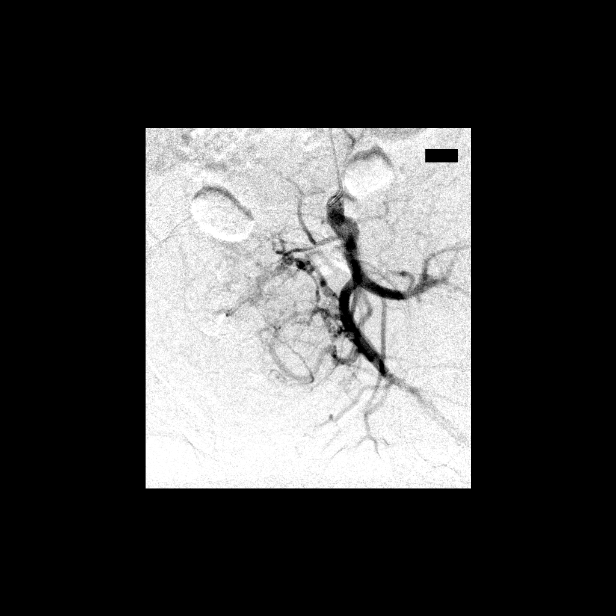

[Series 5: care body 4 · 1 of 16 frames shown (3 of 11)]
[frame 3/16]
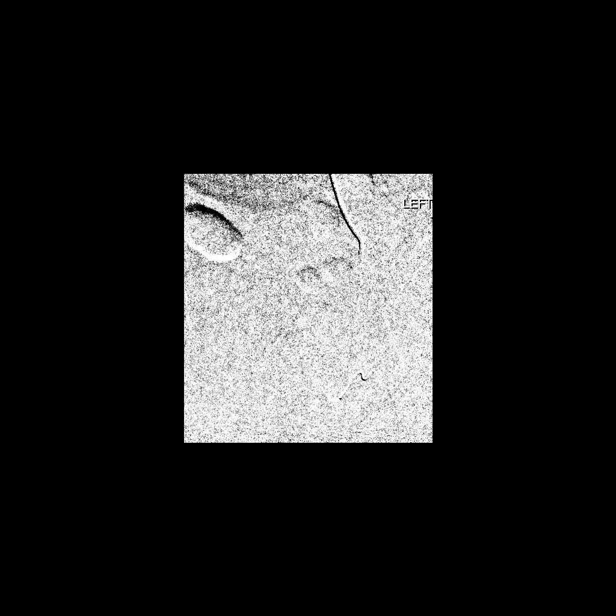

[Series 6: care body 4 · 1 of 32 frames shown (4 of 11)]
[frame 24/32]
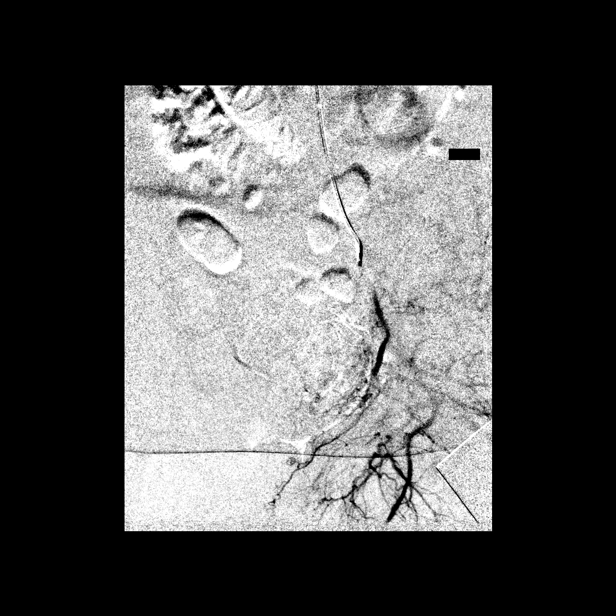

[Series 8: care body 4 · 1 of 17 frames shown (5 of 11)]
[frame 9/17]
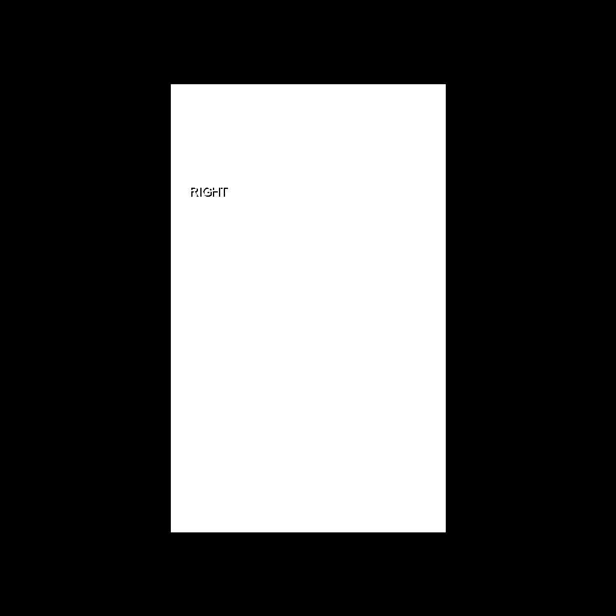

[Series 9: care body 4 · 1 of 17 frames shown (6 of 11)]
[frame 15/17]
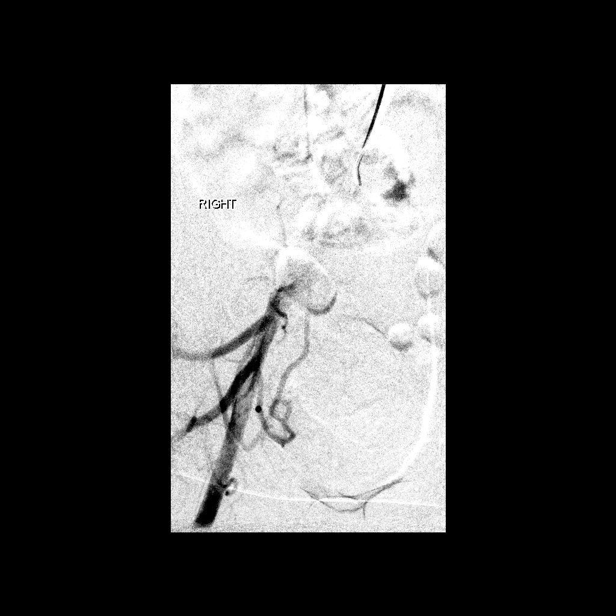

[Series 10: care body 4 · 1 of 17 frames shown (7 of 11)]
[frame 14/17]
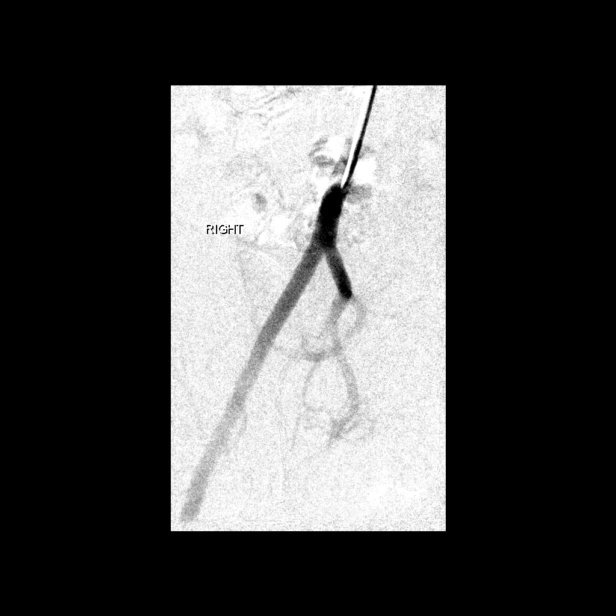

[Series 13: care body 4 · 1 of 26 frames shown (8 of 11)]
[frame 4/26]
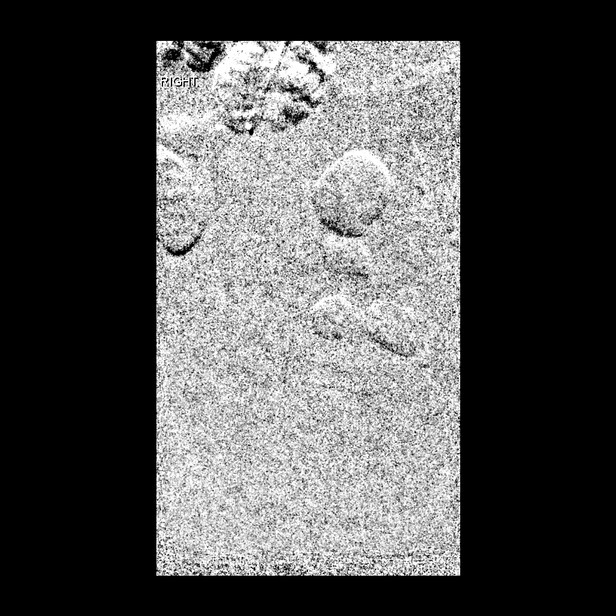

[Series 14: care body 4 · 1 of 21 frames shown (9 of 11)]
[frame 16/21]
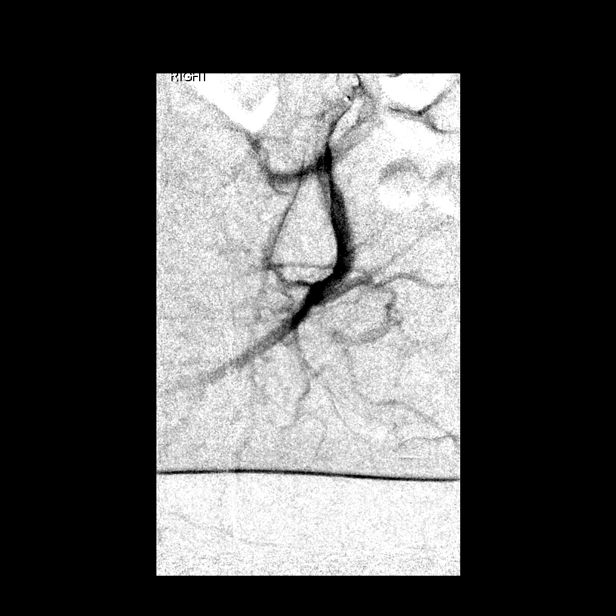

[Series 16: care body 4 · 1 of 23 frames shown (10 of 11)]
[frame 4/23]
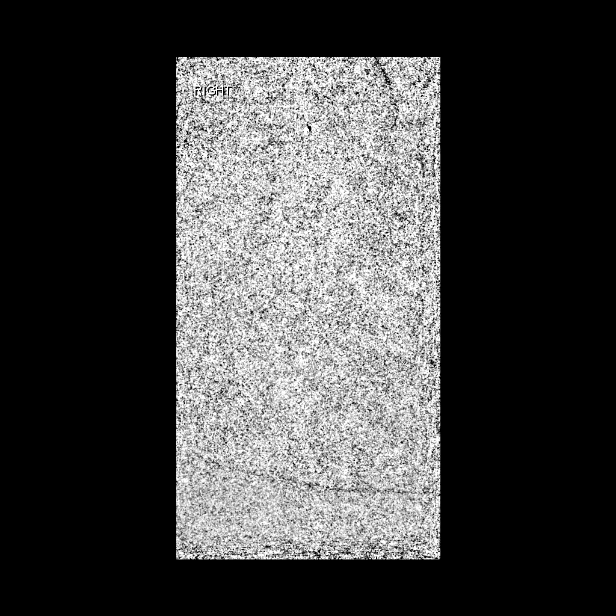

[Series 17: care body 4 · 1 of 21 frames shown (11 of 11)]
[frame 18/21]
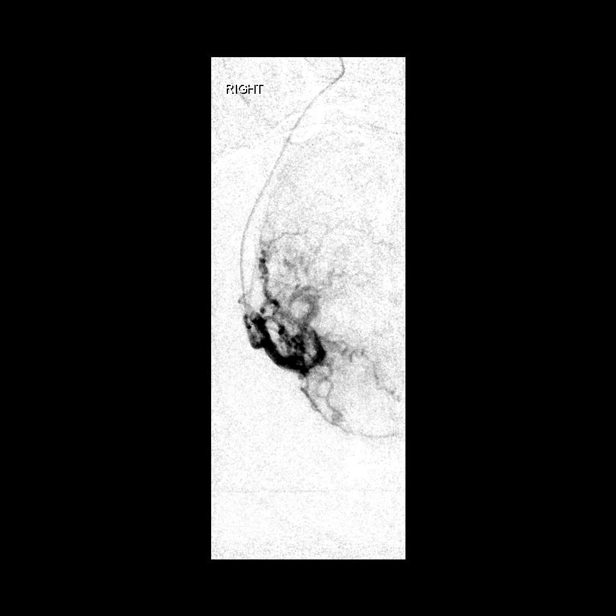

[Series 300: ld dsa body · 1 of 16 slices shown]
[im 16/16]
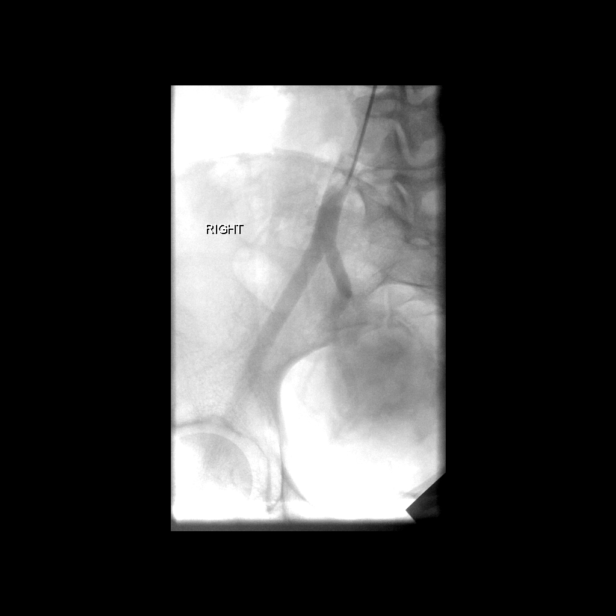

[13 of 24 positions shown; findings below may reference images not displayed]

Left radial artery entry site was determined under ultrasound. Skin
site was marked, prepped with chlorhexidine, and draped in usual
sterile fashion, and infiltrated locally with 1% lidocaine.

Intravenous Fentanyl [LR] and Versed 6mg were administered as
conscious sedation during continuous monitoring of the patient's
level of consciousness and physiological / cardiorespiratory status
by the radiology RN, with a total moderate sedation time of 104
minutes.

Under real-time ultrasound guidance, the left radial artery was
accessed at the wrist with a micropuncture needle sheath set using
singlewall technique in a single pass. Ultrasound imaging
documentation was saved. Intra-arterial
nitroglycerin/verapamil/heparin cocktail administered. Coaxial 5
French angled angiographic catheter was used to selectively
catheterize the left internal iliac artery for pelvic arteriography.
A coaxial microcatheter was advanced with the 016 wire and used to
selectively catheterize the left uterine artery. The microcatheter
tip was positioned in the distal horizontal segment. Selective
arteriogram confirms appropriate positioning. Distal branches of the
left uterine artery were embolized with 500-700 micron Embospheres.
Embolization continued until near stasis of flow was achieved.
Microcatheter was withdrawn and a followup selective left iliac
arteriogram was obtained.

Attention was then directed to the right. The angiographic catheter
would not advance easily distally into the internal iliac artery,
attributed to spasm. This did not respond to repeat bolus of
verapamil and nitroglycerin intra arterial through the sheath.
Catheter exchanged for a 4 French glide catheter which similarly
would not advance adequately distally. This was exchanged for a 4
French 100 cm glide C2 which was advanced to the proximal abdominal
aorta, and the coaxial 150 cm microcatheter with 016 wire advanced
and used to selectively catheterize the right uterine artery.
Confirmatory arteriogram was performed. Right uterine artery
branches were embolized with 500-700 micron Embospheres to near
stasis of flow.

A total of 4 vials of Embospheres were utilized for the case.
Microcatheter and C2 catheter removed. After protocol intra arterial
nitroglycerin/verapamil bolus, sheath was removed and hemostasis
achieved radial band. Patient tolerated the procedure well.

FLUOROSCOPY TIME:  25.7 minute; [LR] mGy

COMPLICATIONS:
COMPLICATIONS
none
IMPRESSION: 1. Technically successful bilateral uterine artery embolization
using 500-700 micron Embospheres.

## 2019-08-11 IMAGING — XA IR US GUIDE VASC ACCESS LEFT
13 of 18 series · 13 of 24 positions shown · IV contrast (IODINE)
Comparison: none

CLINICAL DATA: Symptomatic uterine fibroids if

EXAM:
EXAM
BILATERAL UTERINE ARTERY EMBOLIZATION
TECHNIQUE: The procedure, risks, benefits, and alternatives were explained to
the patient. Questions regarding the procedure were encouraged and
answered. The patient understands and consents to the procedure. As
antibiotic prophylaxis, cefazolin 2 g was ordered pre-procedure and
administered intravenously within one hour of incision.

[Series 1: care body 4 · 1 of 18 frames shown (1 of 11)]
[frame 3/18]
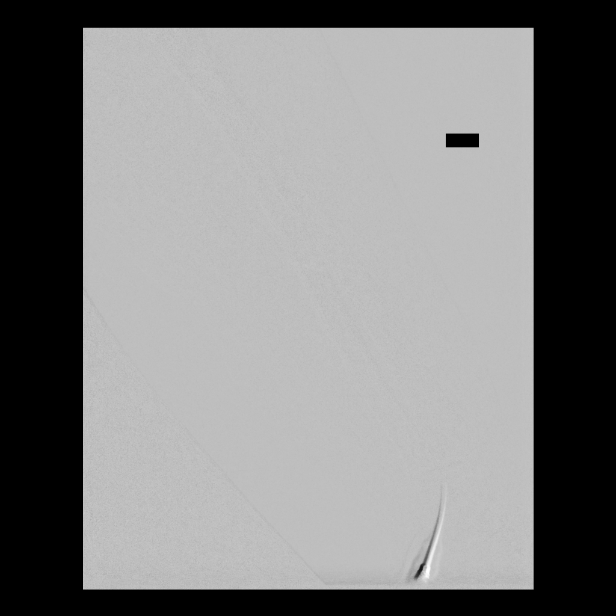

[Series 2: care body 4 · 1 of 16 frames shown (2 of 11)]
[frame 1/16]
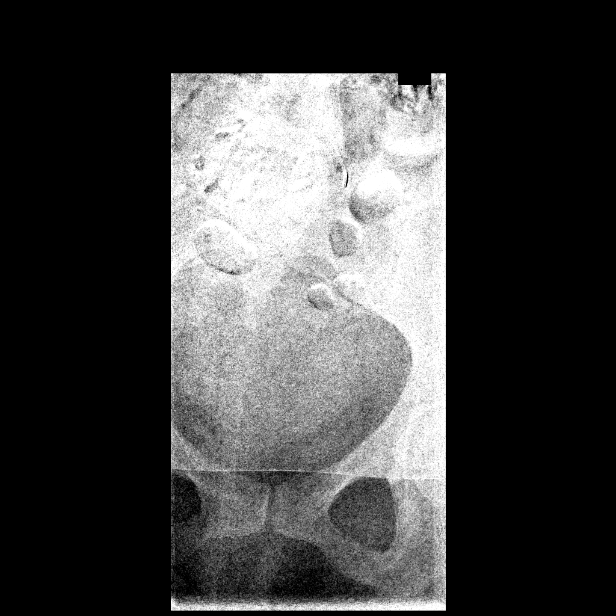

[Series 3: care body 4 · 1 of 17 frames shown (3 of 11)]
[frame 15/17]
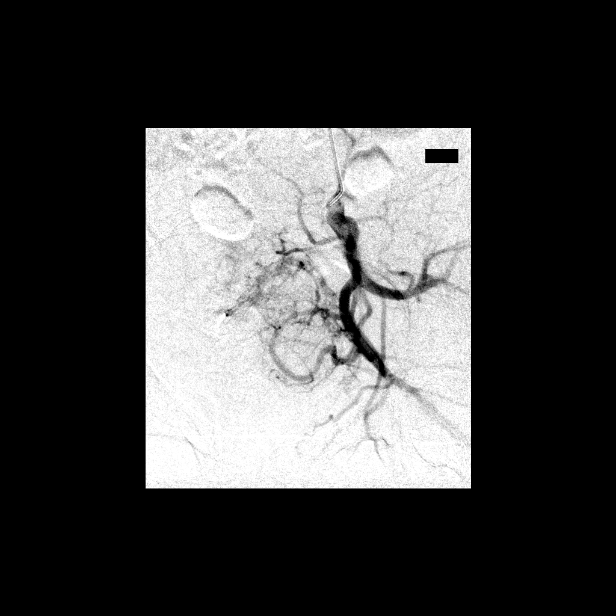

[Series 5: care body 4 · 1 of 16 frames shown (4 of 11)]
[frame 9/16]
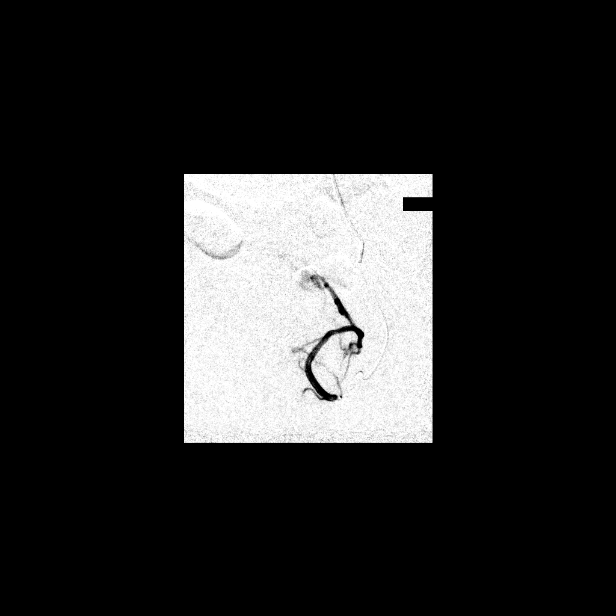

[Series 6: care body 4 · 1 of 32 frames shown (5 of 11)]
[frame 28/32]
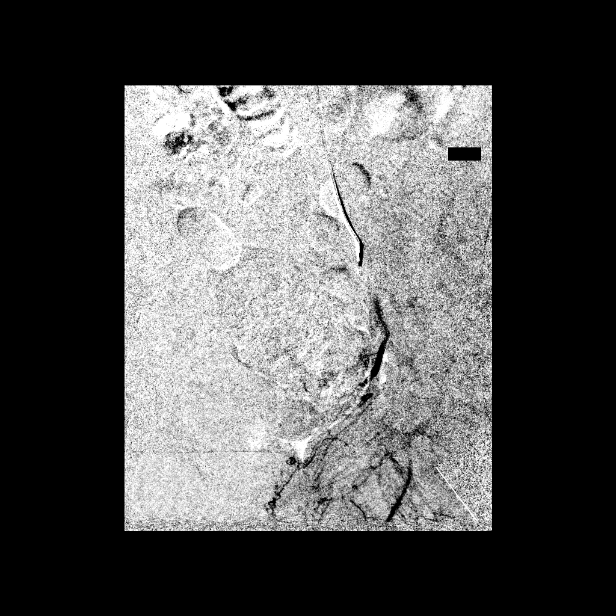

[Series 8: care body 4 · 1 of 17 frames shown (6 of 11)]
[frame 15/17]
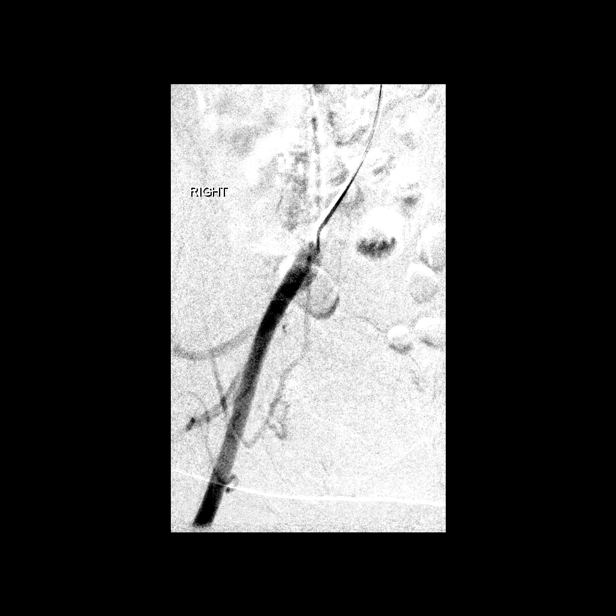

[Series 10: care body 4 · 1 of 17 frames shown (7 of 11)]
[frame 9/17]
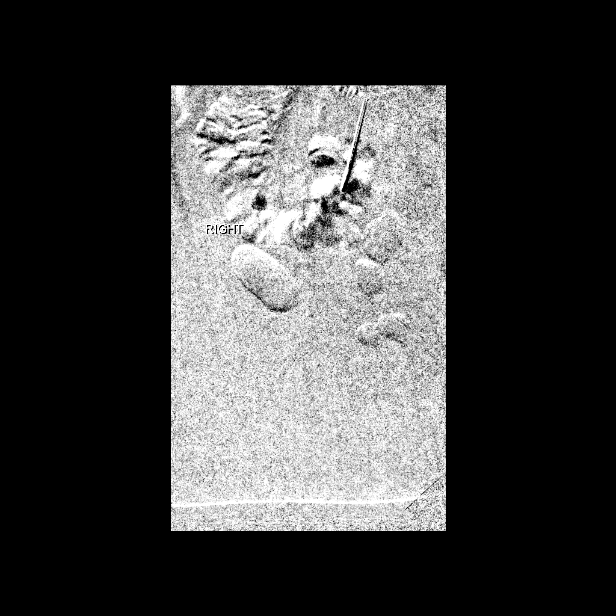

[Series 12: care body 4 · 1 of 16 frames shown (8 of 11)]
[frame 3/16]
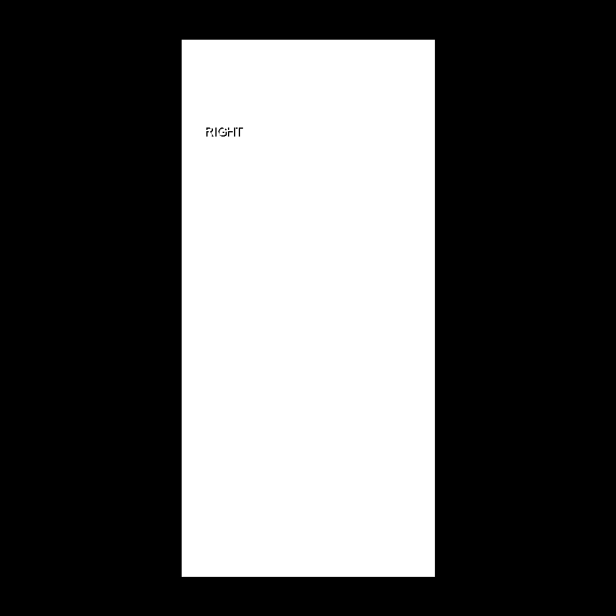

[Series 13: care body 4 · 1 of 26 frames shown (9 of 11)]
[frame 22/26]
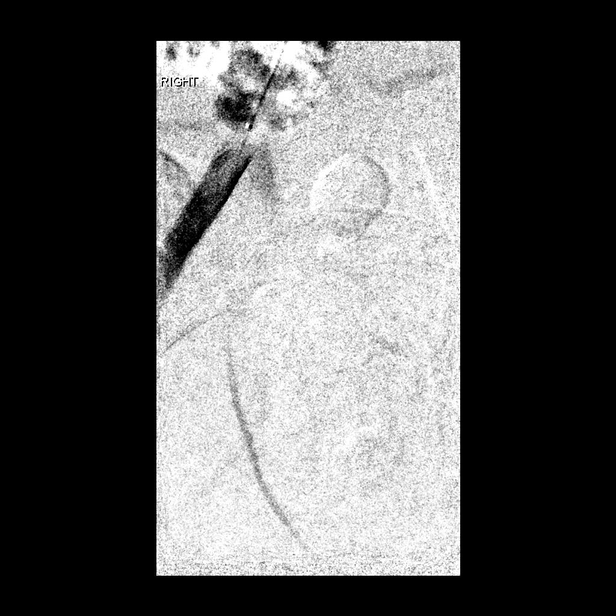

[Series 15: care body 4 · 1 of 26 frames shown (10 of 11)]
[frame 4/26]
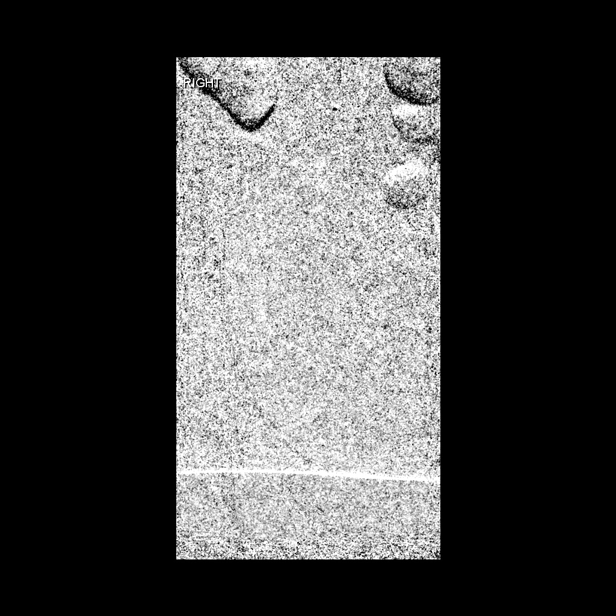

[Series 16: care body 4 · 1 of 23 frames shown (11 of 11)]
[frame 20/23]
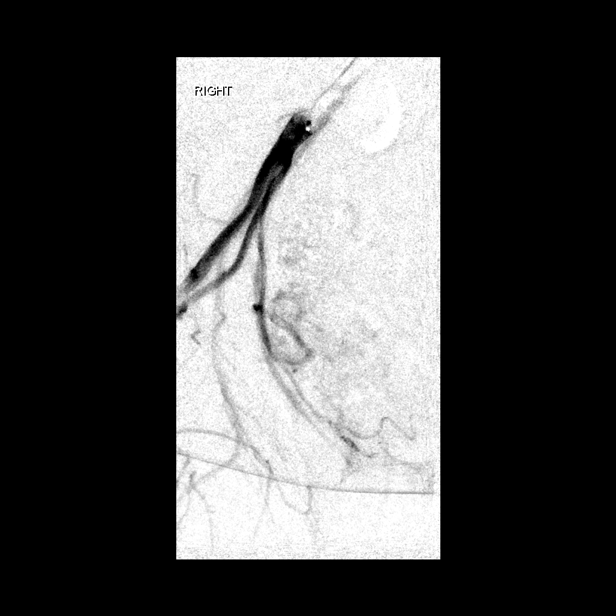

[Series 18: fl - angio · 1 of 42 frames shown]
[frame 32/42]
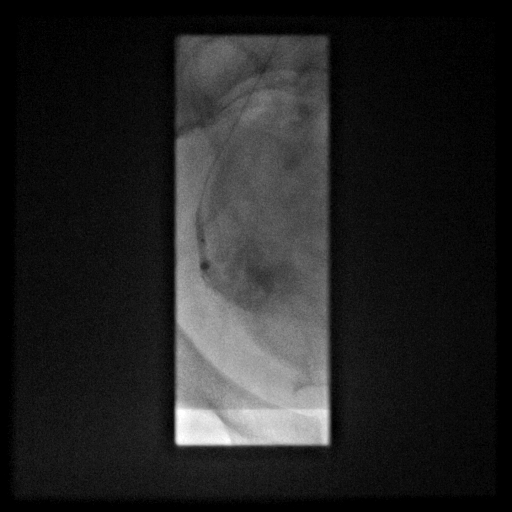

[Series 300: ld dsa body · 1 of 16 slices shown]
[im 16/16]
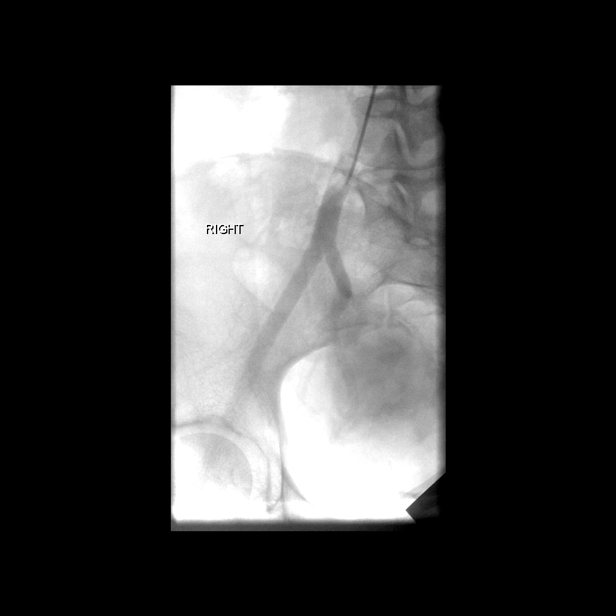

[13 of 24 positions shown; findings below may reference images not displayed]

Left radial artery entry site was determined under ultrasound. Skin
site was marked, prepped with chlorhexidine, and draped in usual
sterile fashion, and infiltrated locally with 1% lidocaine.

Intravenous Fentanyl [LR] and Versed 6mg were administered as
conscious sedation during continuous monitoring of the patient's
level of consciousness and physiological / cardiorespiratory status
by the radiology RN, with a total moderate sedation time of 104
minutes.

Under real-time ultrasound guidance, the left radial artery was
accessed at the wrist with a micropuncture needle sheath set using
singlewall technique in a single pass. Ultrasound imaging
documentation was saved. Intra-arterial
nitroglycerin/verapamil/heparin cocktail administered. Coaxial 5
French angled angiographic catheter was used to selectively
catheterize the left internal iliac artery for pelvic arteriography.
A coaxial microcatheter was advanced with the 016 wire and used to
selectively catheterize the left uterine artery. The microcatheter
tip was positioned in the distal horizontal segment. Selective
arteriogram confirms appropriate positioning. Distal branches of the
left uterine artery were embolized with 500-700 micron Embospheres.
Embolization continued until near stasis of flow was achieved.
Microcatheter was withdrawn and a followup selective left iliac
arteriogram was obtained.

Attention was then directed to the right. The angiographic catheter
would not advance easily distally into the internal iliac artery,
attributed to spasm. This did not respond to repeat bolus of
verapamil and nitroglycerin intra arterial through the sheath.
Catheter exchanged for a 4 French glide catheter which similarly
would not advance adequately distally. This was exchanged for a 4
French 100 cm glide C2 which was advanced to the proximal abdominal
aorta, and the coaxial 150 cm microcatheter with 016 wire advanced
and used to selectively catheterize the right uterine artery.
Confirmatory arteriogram was performed. Right uterine artery
branches were embolized with 500-700 micron Embospheres to near
stasis of flow.

A total of 4 vials of Embospheres were utilized for the case.
Microcatheter and C2 catheter removed. After protocol intra arterial
nitroglycerin/verapamil bolus, sheath was removed and hemostasis
achieved radial band. Patient tolerated the procedure well.

FLUOROSCOPY TIME:  25.7 minute; [LR] mGy

COMPLICATIONS:
COMPLICATIONS
none
IMPRESSION: 1. Technically successful bilateral uterine artery embolization
using 500-700 micron Embospheres.

## 2019-08-11 IMAGING — XA IR ANGIO/PELVIC SELECTIVE EA VESSEL
14 of 19 series · 14 of 24 positions shown · IV contrast (IODINE)
Comparison: none

CLINICAL DATA: Symptomatic uterine fibroids if

EXAM:
EXAM
BILATERAL UTERINE ARTERY EMBOLIZATION
TECHNIQUE: The procedure, risks, benefits, and alternatives were explained to
the patient. Questions regarding the procedure were encouraged and
answered. The patient understands and consents to the procedure. As
antibiotic prophylaxis, cefazolin 2 g was ordered pre-procedure and
administered intravenously within one hour of incision.

[Series 1: (id) · 1 of 3 slices shown]
[im 1/3]
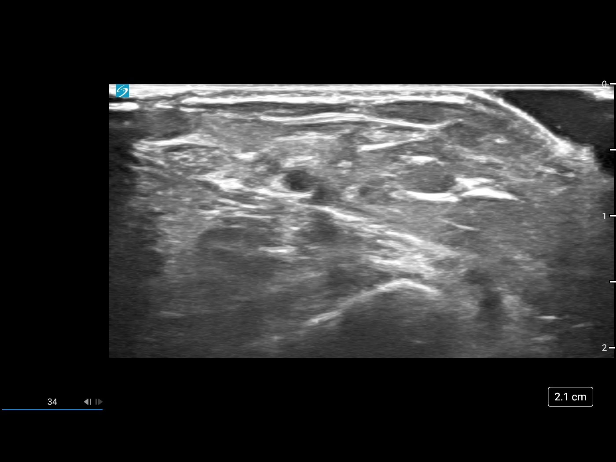

[Series 2: care body 4 · 1 of 16 frames shown (1 of 11)]
[frame 1/16]
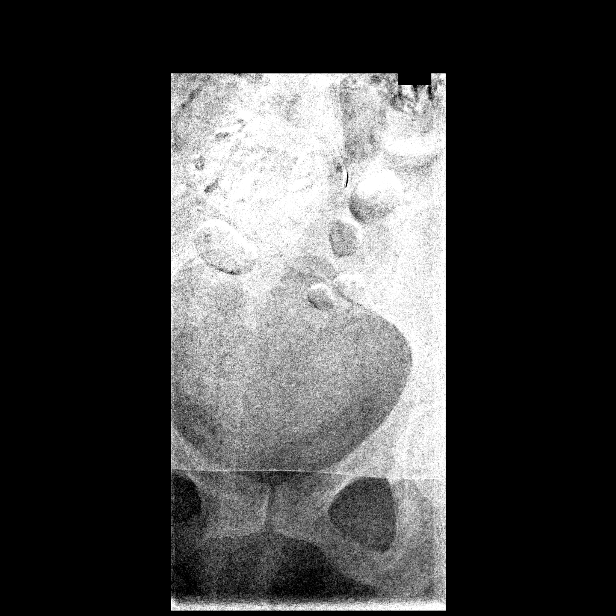

[Series 3: care body 4 · 1 of 17 frames shown (2 of 11)]
[frame 15/17]
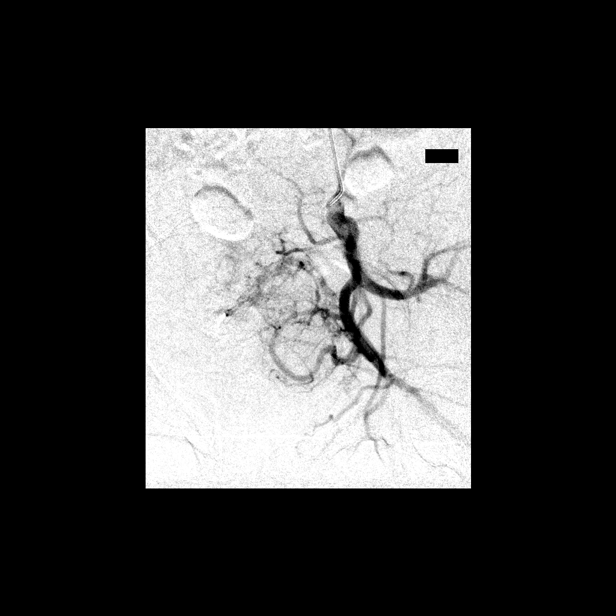

[Series 5: care body 4 · 1 of 16 frames shown (3 of 11)]
[frame 9/16]
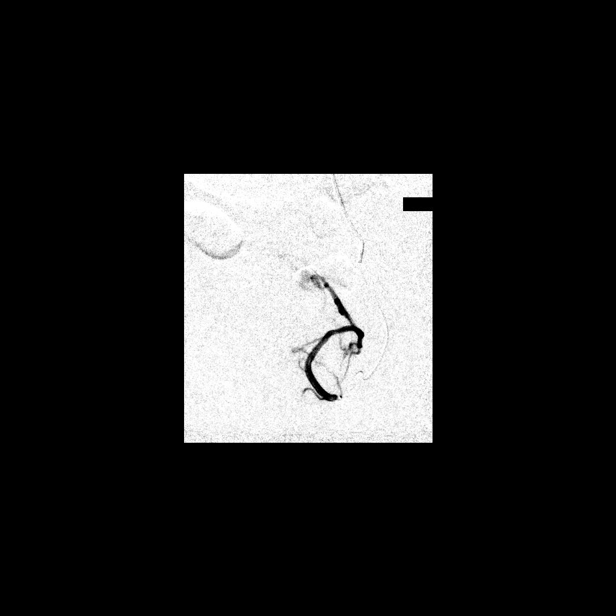

[Series 6: care body 4 · 1 of 32 frames shown (4 of 11)]
[frame 5/32]
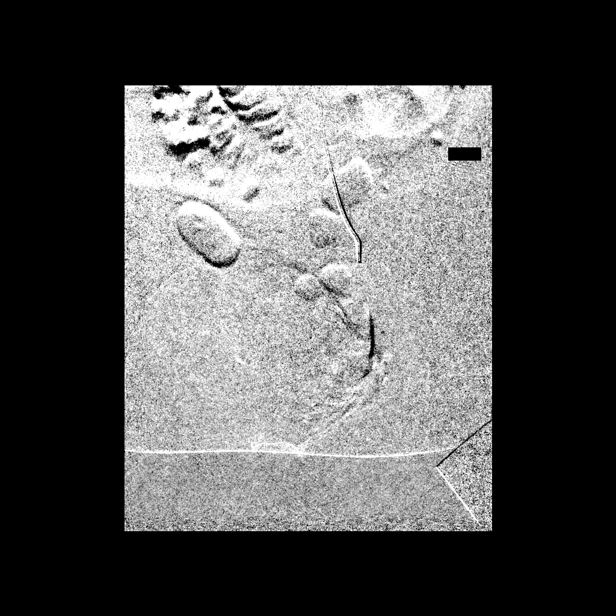

[Series 7: care body 4 · 1 of 15 frames shown (5 of 11)]
[frame 13/15]
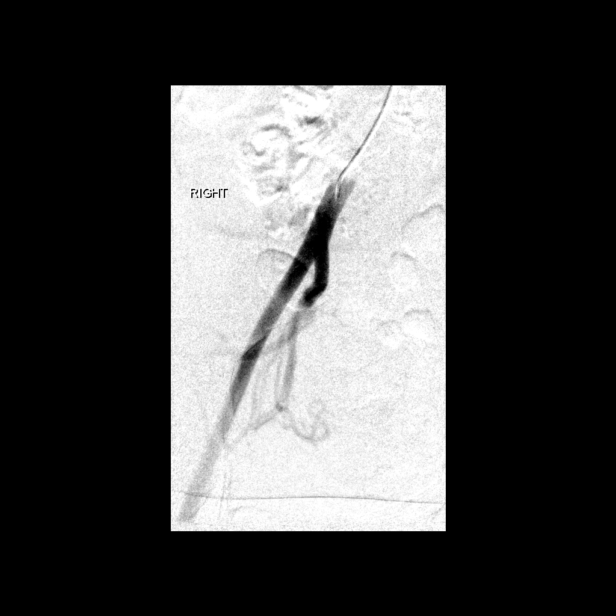

[Series 9: care body 4 · 1 of 17 frames shown (6 of 11)]
[frame 6/17]
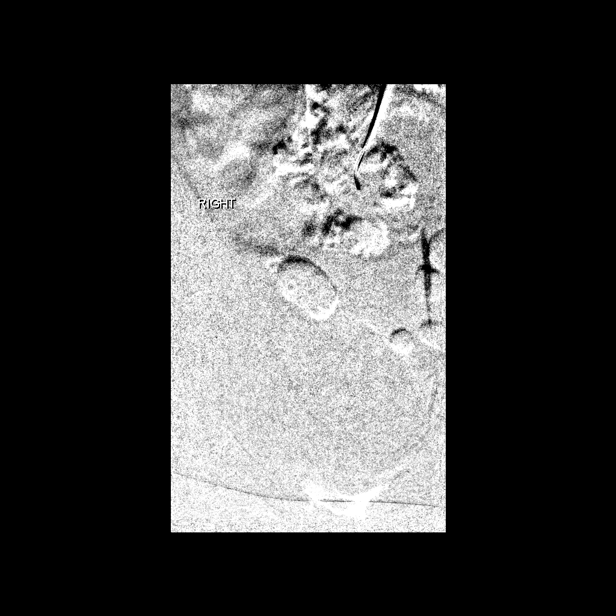

[Series 10: care body 4 · 1 of 17 frames shown (7 of 11)]
[frame 9/17]
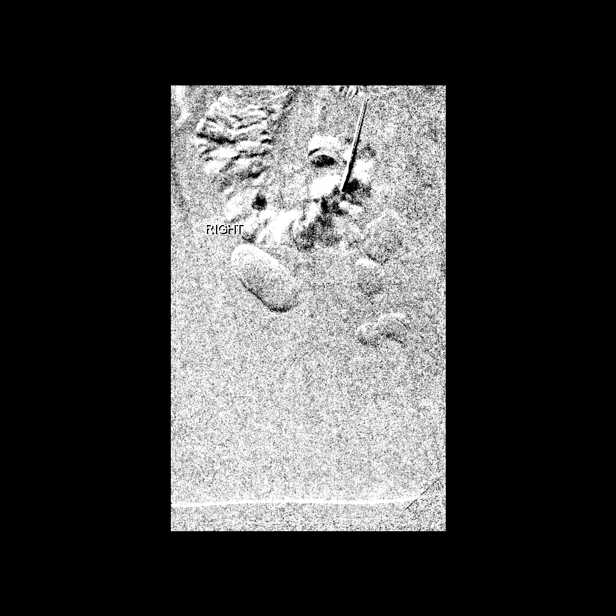

[Series 12: care body 4 · 1 of 16 frames shown (8 of 11)]
[frame 16/16]
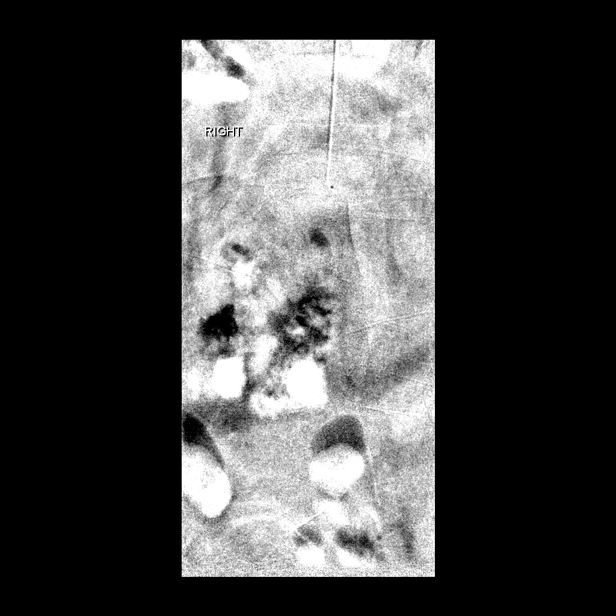

[Series 14: care body 4 · 1 of 21 frames shown (9 of 11)]
[frame 11/21]
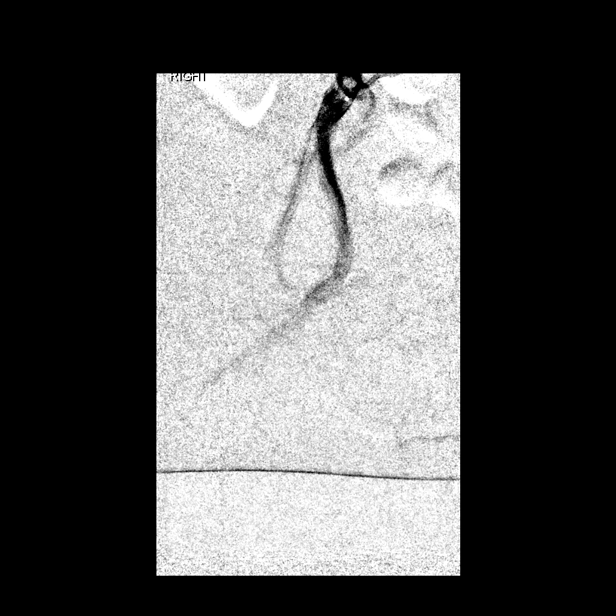

[Series 15: care body 4 · 1 of 26 frames shown (10 of 11)]
[frame 23/26]
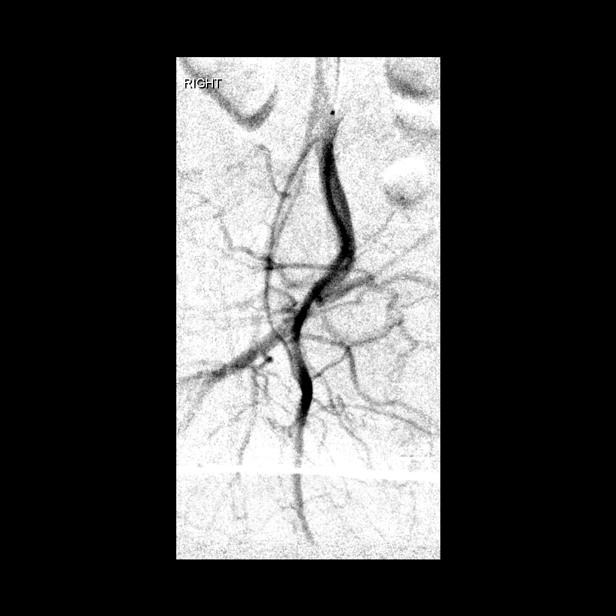

[Series 16: care body 4 · 1 of 23 frames shown (11 of 11)]
[frame 20/23]
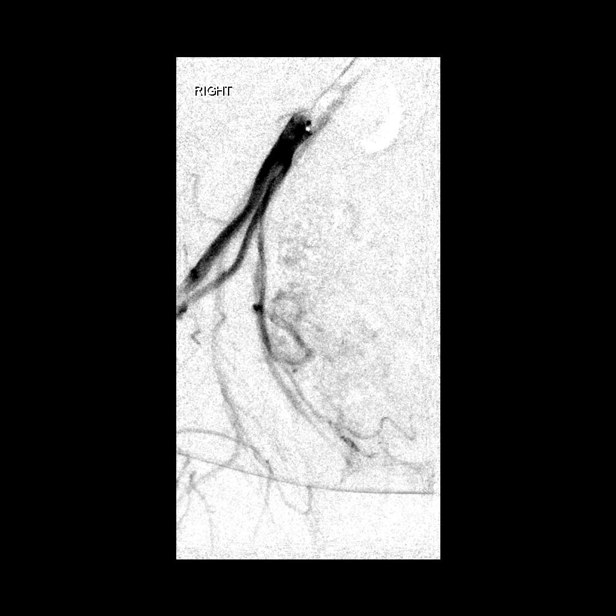

[Series 18: fl - angio · 1 of 42 frames shown]
[frame 32/42]
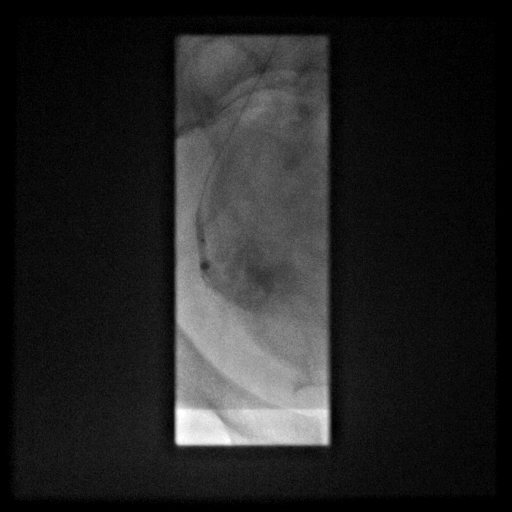

[Series 300: ld dsa body · 1 of 16 slices shown]
[im 16/16]
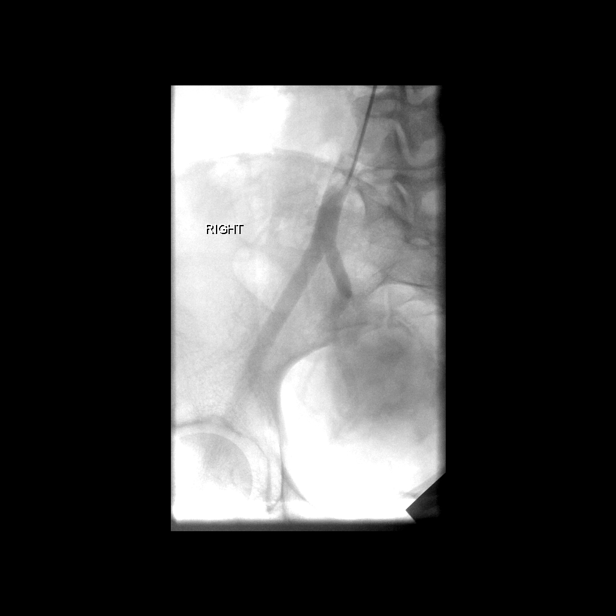

[14 of 24 positions shown; findings below may reference images not displayed]

Left radial artery entry site was determined under ultrasound. Skin
site was marked, prepped with chlorhexidine, and draped in usual
sterile fashion, and infiltrated locally with 1% lidocaine.

Intravenous Fentanyl [LR] and Versed 6mg were administered as
conscious sedation during continuous monitoring of the patient's
level of consciousness and physiological / cardiorespiratory status
by the radiology RN, with a total moderate sedation time of 104
minutes.

Under real-time ultrasound guidance, the left radial artery was
accessed at the wrist with a micropuncture needle sheath set using
singlewall technique in a single pass. Ultrasound imaging
documentation was saved. Intra-arterial
nitroglycerin/verapamil/heparin cocktail administered. Coaxial 5
French angled angiographic catheter was used to selectively
catheterize the left internal iliac artery for pelvic arteriography.
A coaxial microcatheter was advanced with the 016 wire and used to
selectively catheterize the left uterine artery. The microcatheter
tip was positioned in the distal horizontal segment. Selective
arteriogram confirms appropriate positioning. Distal branches of the
left uterine artery were embolized with 500-700 micron Embospheres.
Embolization continued until near stasis of flow was achieved.
Microcatheter was withdrawn and a followup selective left iliac
arteriogram was obtained.

Attention was then directed to the right. The angiographic catheter
would not advance easily distally into the internal iliac artery,
attributed to spasm. This did not respond to repeat bolus of
verapamil and nitroglycerin intra arterial through the sheath.
Catheter exchanged for a 4 French glide catheter which similarly
would not advance adequately distally. This was exchanged for a 4
French 100 cm glide C2 which was advanced to the proximal abdominal
aorta, and the coaxial 150 cm microcatheter with 016 wire advanced
and used to selectively catheterize the right uterine artery.
Confirmatory arteriogram was performed. Right uterine artery
branches were embolized with 500-700 micron Embospheres to near
stasis of flow.

A total of 4 vials of Embospheres were utilized for the case.
Microcatheter and C2 catheter removed. After protocol intra arterial
nitroglycerin/verapamil bolus, sheath was removed and hemostasis
achieved radial band. Patient tolerated the procedure well.

FLUOROSCOPY TIME:  25.7 minute; [LR] mGy

COMPLICATIONS:
COMPLICATIONS
none
IMPRESSION: 1. Technically successful bilateral uterine artery embolization
using 500-700 micron Embospheres.

## 2019-08-11 IMAGING — XA IR ANGIO/PELVIC SELECTIVE EA VESSEL
14 of 19 series · 14 of 24 positions shown · non-contrast
Comparison: none

CLINICAL DATA: Symptomatic uterine fibroids if

EXAM:
EXAM
BILATERAL UTERINE ARTERY EMBOLIZATION
TECHNIQUE: The procedure, risks, benefits, and alternatives were explained to
the patient. Questions regarding the procedure were encouraged and
answered. The patient understands and consents to the procedure. As
antibiotic prophylaxis, cefazolin 2 g was ordered pre-procedure and
administered intravenously within one hour of incision.

[Series 1: (id) · 1 of 2 slices shown]
[im 1/2]
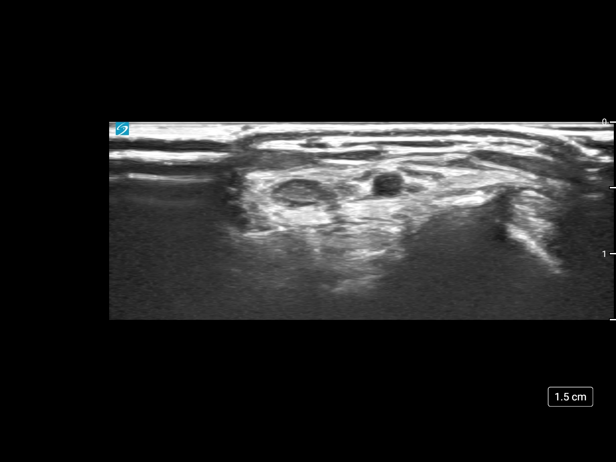

[Series 2: care body 4 · 1 of 16 frames shown (1 of 11)]
[frame 1/16]
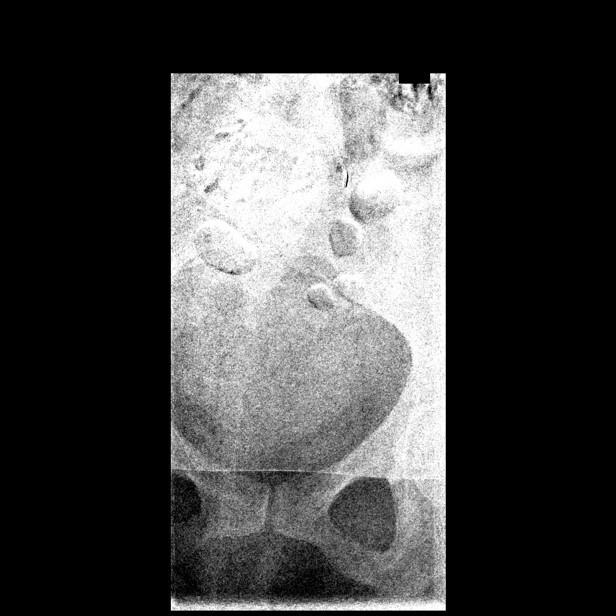

[Series 3: care body 4 · 1 of 17 frames shown (2 of 11)]
[frame 15/17]
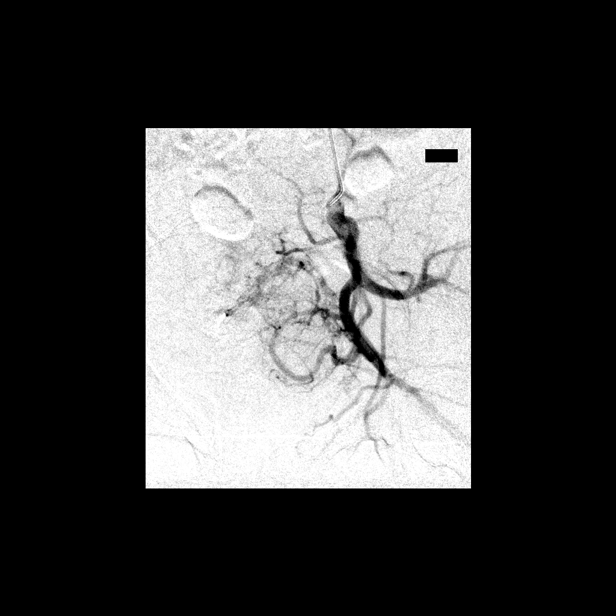

[Series 5: care body 4 · 1 of 16 frames shown (3 of 11)]
[frame 9/16]
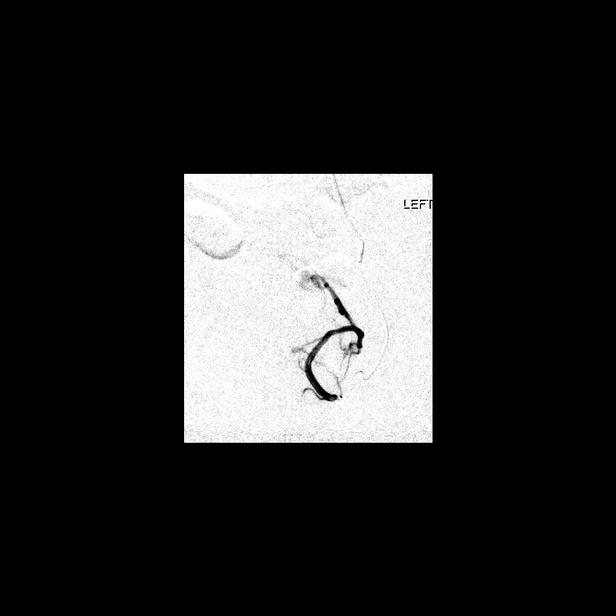

[Series 6: care body 4 · 1 of 32 frames shown (4 of 11)]
[frame 5/32]
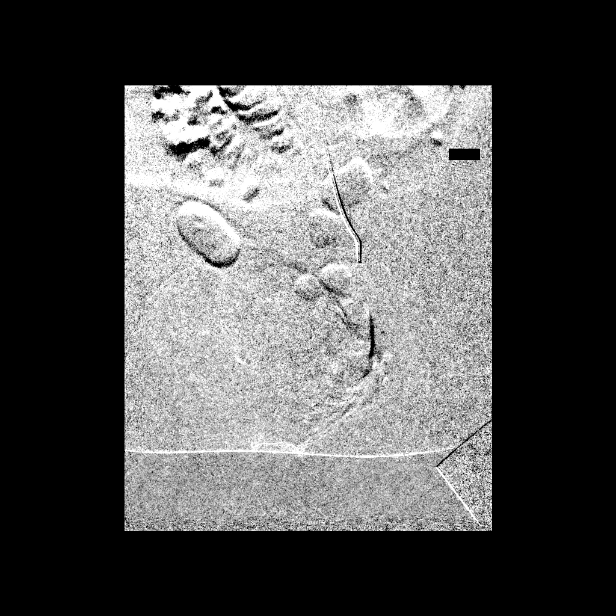

[Series 7: care body 4 · 1 of 15 frames shown (5 of 11)]
[frame 13/15]
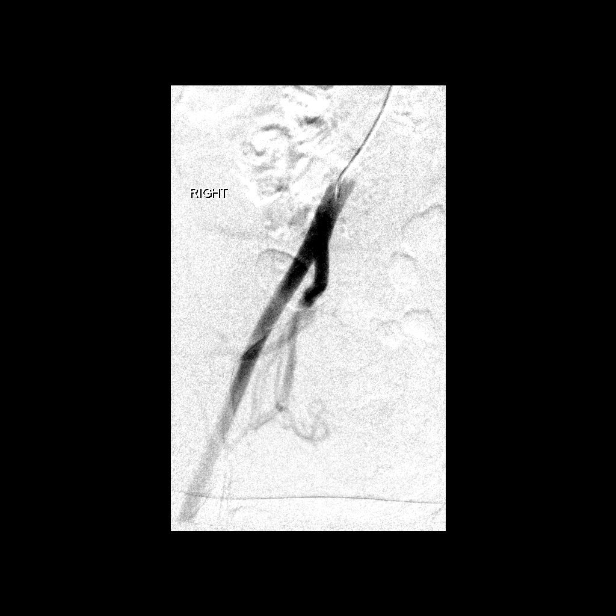

[Series 9: care body 4 · 1 of 17 frames shown (6 of 11)]
[frame 6/17]
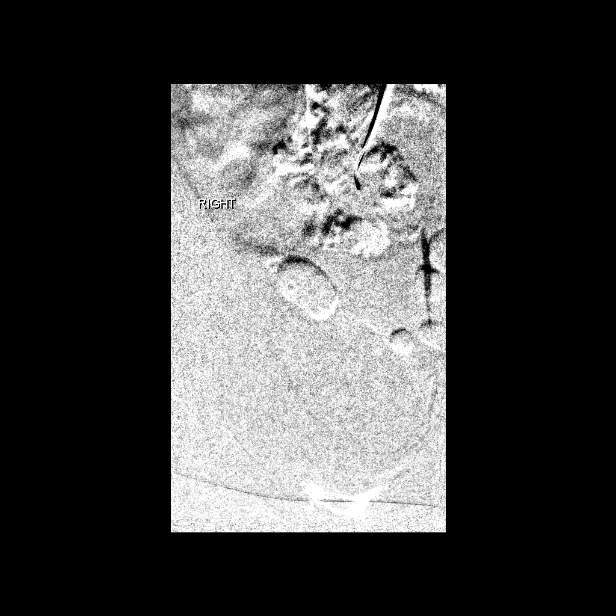

[Series 10: care body 4 · 1 of 17 frames shown (7 of 11)]
[frame 9/17]
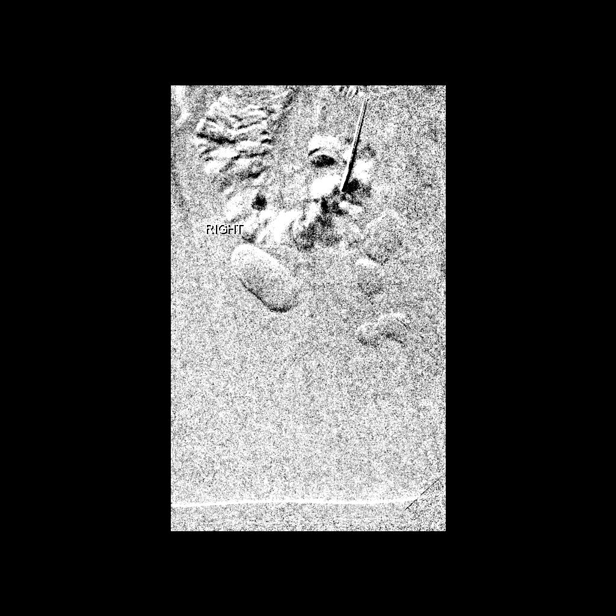

[Series 12: care body 4 · 1 of 16 frames shown (8 of 11)]
[frame 16/16]
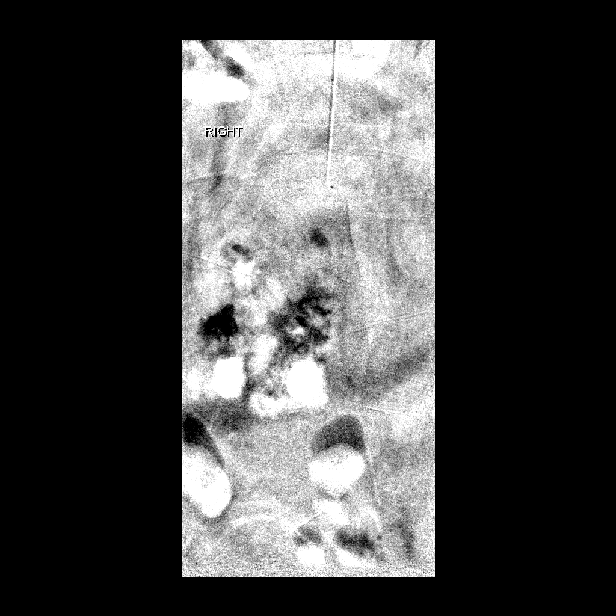

[Series 14: care body 4 · 1 of 21 frames shown (9 of 11)]
[frame 11/21]
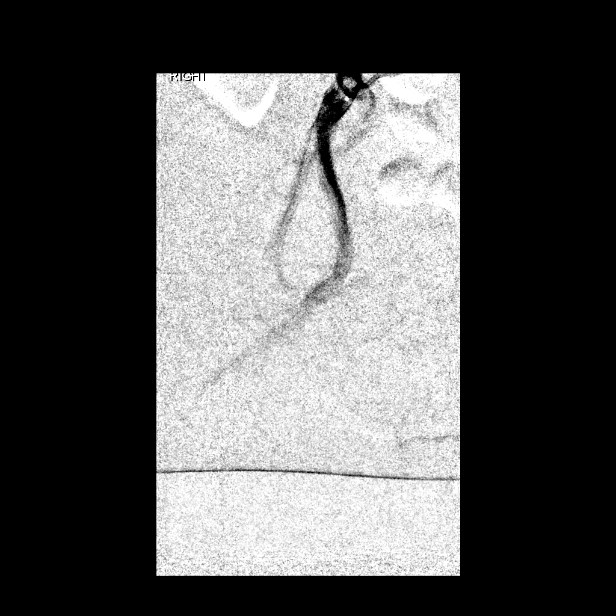

[Series 15: care body 4 · 1 of 26 frames shown (10 of 11)]
[frame 23/26]
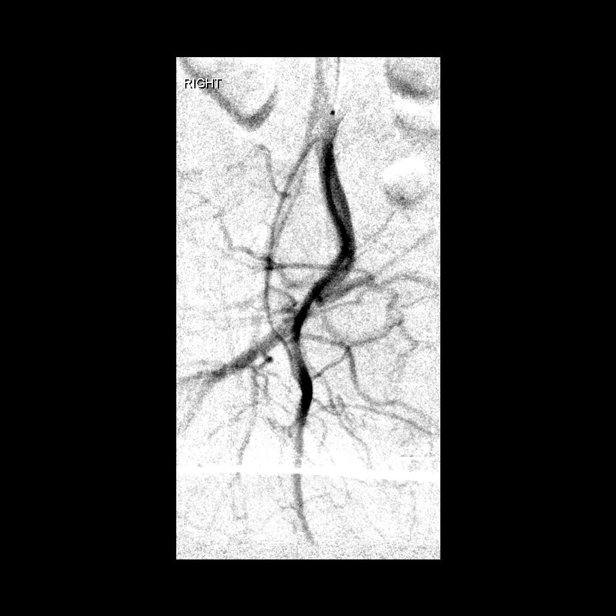

[Series 16: care body 4 · 1 of 23 frames shown (11 of 11)]
[frame 20/23]
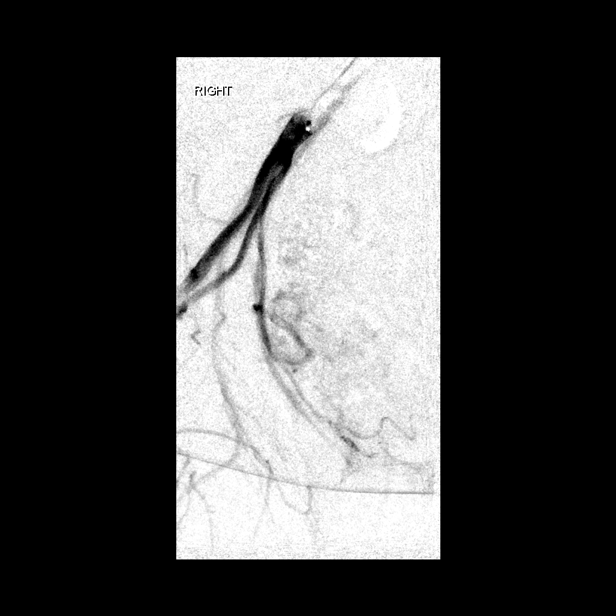

[Series 18: fl - angio · 1 of 42 frames shown]
[frame 32/42]
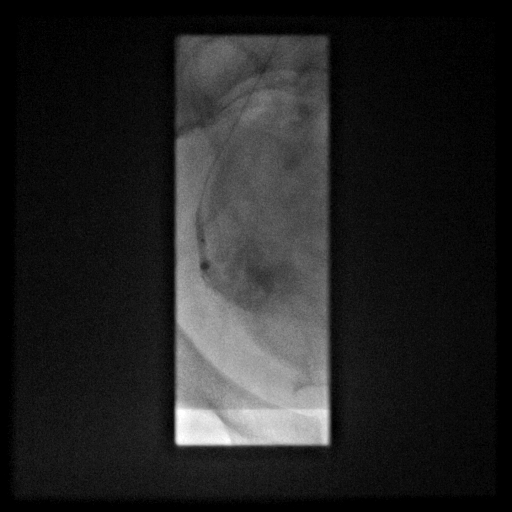

[Series 300: ld dsa body · 1 of 16 slices shown]
[im 16/16]
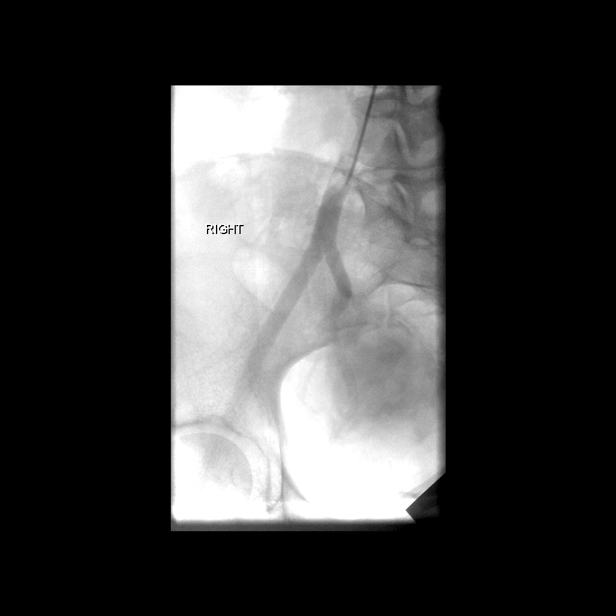

[14 of 24 positions shown; findings below may reference images not displayed]

Left radial artery entry site was determined under ultrasound. Skin
site was marked, prepped with chlorhexidine, and draped in usual
sterile fashion, and infiltrated locally with 1% lidocaine.

Intravenous Fentanyl [LR] and Versed 6mg were administered as
conscious sedation during continuous monitoring of the patient's
level of consciousness and physiological / cardiorespiratory status
by the radiology RN, with a total moderate sedation time of 104
minutes.

Under real-time ultrasound guidance, the left radial artery was
accessed at the wrist with a micropuncture needle sheath set using
singlewall technique in a single pass. Ultrasound imaging
documentation was saved. Intra-arterial
nitroglycerin/verapamil/heparin cocktail administered. Coaxial 5
French angled angiographic catheter was used to selectively
catheterize the left internal iliac artery for pelvic arteriography.
A coaxial microcatheter was advanced with the 016 wire and used to
selectively catheterize the left uterine artery. The microcatheter
tip was positioned in the distal horizontal segment. Selective
arteriogram confirms appropriate positioning. Distal branches of the
left uterine artery were embolized with 500-700 micron Embospheres.
Embolization continued until near stasis of flow was achieved.
Microcatheter was withdrawn and a followup selective left iliac
arteriogram was obtained.

Attention was then directed to the right. The angiographic catheter
would not advance easily distally into the internal iliac artery,
attributed to spasm. This did not respond to repeat bolus of
verapamil and nitroglycerin intra arterial through the sheath.
Catheter exchanged for a 4 French glide catheter which similarly
would not advance adequately distally. This was exchanged for a 4
French 100 cm glide C2 which was advanced to the proximal abdominal
aorta, and the coaxial 150 cm microcatheter with 016 wire advanced
and used to selectively catheterize the right uterine artery.
Confirmatory arteriogram was performed. Right uterine artery
branches were embolized with 500-700 micron Embospheres to near
stasis of flow.

A total of 4 vials of Embospheres were utilized for the case.
Microcatheter and C2 catheter removed. After protocol intra arterial
nitroglycerin/verapamil bolus, sheath was removed and hemostasis
achieved radial band. Patient tolerated the procedure well.

FLUOROSCOPY TIME:  25.7 minute; [LR] mGy

COMPLICATIONS:
COMPLICATIONS
none
IMPRESSION: 1. Technically successful bilateral uterine artery embolization
using 500-700 micron Embospheres.

## 2019-08-11 MED ORDER — SODIUM CHLORIDE 0.9% FLUSH: 3.0000 mL | INTRAVENOUS | Status: DC | PRN

## 2019-08-11 MED ORDER — SENNOSIDES-DOCUSATE SODIUM 8.6-50 MG PO TABS: 1.0000 | ORAL_TABLET | Freq: Every evening | ORAL | Status: DC | PRN

## 2019-08-11 MED ORDER — VERAPAMIL HCL 2.5 MG/ML IV SOLN: INTRA_ARTERIAL | Status: AC | PRN

## 2019-08-11 MED ORDER — DIPHENHYDRAMINE HCL 12.5 MG/5ML PO ELIX
12.5000 mg | ORAL_SOLUTION | Freq: Four times a day (QID) | ORAL | Status: DC | PRN
  Filled 2019-08-11: qty 5

## 2019-08-11 MED ORDER — SODIUM CHLORIDE 0.9 % IV SOLN: 250.0000 mL | INTRAVENOUS | Status: DC | PRN

## 2019-08-11 MED ORDER — DOCUSATE SODIUM 100 MG PO CAPS
100.0000 mg | ORAL_CAPSULE | Freq: Two times a day (BID) | ORAL | Status: DC
  Administered 2019-08-11 – 2019-08-12 (×2): 100 mg via ORAL
  Filled 2019-08-11 (×2): qty 1

## 2019-08-11 MED ORDER — NITROGLYCERIN 1 MG/10 ML FOR IR/CATH LAB: INTRA_ARTERIAL | Status: AC | PRN

## 2019-08-11 MED ORDER — ONDANSETRON HCL 4 MG/2ML IJ SOLN
4.0000 mg | Freq: Four times a day (QID) | INTRAMUSCULAR | Status: DC | PRN
  Administered 2019-08-11: 10:00:00 4 mg via INTRAVENOUS
  Filled 2019-08-11: qty 2

## 2019-08-11 MED ORDER — MIDAZOLAM HCL 2 MG/2ML IJ SOLN
INTRAMUSCULAR | Status: AC | PRN
  Administered 2019-08-11 (×5): 1 mg via INTRAVENOUS

## 2019-08-11 MED ORDER — FENTANYL CITRATE (PF) 100 MCG/2ML IJ SOLN
INTRAMUSCULAR | Status: AC
  Filled 2019-08-11: qty 4

## 2019-08-11 MED ORDER — PROMETHAZINE HCL 25 MG PO TABS: 25.0000 mg | ORAL_TABLET | Freq: Three times a day (TID) | ORAL | Status: DC | PRN

## 2019-08-11 MED ORDER — ONDANSETRON HCL 4 MG/2ML IJ SOLN: 4.0000 mg | Freq: Four times a day (QID) | INTRAMUSCULAR | Status: DC | PRN

## 2019-08-11 MED ORDER — IOHEXOL 300 MG/ML  SOLN
100.0000 mL | Freq: Once | INTRAMUSCULAR | Status: AC | PRN
  Administered 2019-08-11: 13:00:00 28 mL via INTRA_ARTERIAL

## 2019-08-11 MED ORDER — VERAPAMIL HCL 2.5 MG/ML IV SOLN
INTRAVENOUS | Status: AC
  Filled 2019-08-11: qty 2

## 2019-08-11 MED ORDER — HEPARIN SODIUM (PORCINE) 1000 UNIT/ML IJ SOLN
INTRAMUSCULAR | Status: AC
  Filled 2019-08-11: qty 1

## 2019-08-11 MED ORDER — CEFAZOLIN SODIUM-DEXTROSE 2-4 GM/100ML-% IV SOLN: 2.0000 g | Freq: Once | INTRAVENOUS | Status: AC

## 2019-08-11 MED ORDER — PROMETHAZINE HCL 25 MG RE SUPP
25.0000 mg | Freq: Three times a day (TID) | RECTAL | Status: DC | PRN
  Filled 2019-08-11: qty 1

## 2019-08-11 MED ORDER — DIPHENHYDRAMINE HCL 50 MG/ML IJ SOLN: 12.5000 mg | Freq: Four times a day (QID) | INTRAMUSCULAR | Status: DC | PRN

## 2019-08-11 MED ORDER — LIDOCAINE HCL (PF) 1 % IJ SOLN
INTRAMUSCULAR | Status: AC | PRN
  Administered 2019-08-11: 5 mL

## 2019-08-11 MED ORDER — NALOXONE HCL 0.4 MG/ML IJ SOLN: 0.4000 mg | INTRAMUSCULAR | Status: DC | PRN

## 2019-08-11 MED ORDER — SODIUM CHLORIDE 0.9 % IV SOLN: INTRAVENOUS | Status: DC

## 2019-08-11 MED ORDER — NITROGLYCERIN IN D5W 100-5 MCG/ML-% IV SOLN
INTRAVENOUS | Status: AC
  Filled 2019-08-11: qty 250

## 2019-08-11 MED ORDER — HYDROMORPHONE HCL 1 MG/ML IJ SOLN
1.0000 mg | INTRAMUSCULAR | Status: DC | PRN
  Administered 2019-08-11 – 2019-08-12 (×4): 1 mg via INTRAVENOUS
  Filled 2019-08-11 (×4): qty 1

## 2019-08-11 MED ORDER — IOHEXOL 300 MG/ML  SOLN
100.0000 mL | Freq: Once | INTRAMUSCULAR | Status: AC | PRN
  Administered 2019-08-11: 13:00:00 35 mL via INTRA_ARTERIAL

## 2019-08-11 MED ORDER — KETOROLAC TROMETHAMINE 30 MG/ML IJ SOLN
30.0000 mg | Freq: Once | INTRAMUSCULAR | Status: AC
  Administered 2019-08-11: 10:00:00 30 mg via INTRAVENOUS
  Filled 2019-08-11: qty 1

## 2019-08-11 MED ORDER — LIDOCAINE HCL 1 % IJ SOLN
INTRAMUSCULAR | Status: AC
  Filled 2019-08-11: qty 20

## 2019-08-11 MED ORDER — ZOLPIDEM TARTRATE 5 MG PO TABS: 5.0000 mg | ORAL_TABLET | Freq: Every evening | ORAL | Status: DC | PRN

## 2019-08-11 MED ORDER — MAGNESIUM CITRATE PO SOLN: 1.0000 | Freq: Once | ORAL | Status: DC | PRN

## 2019-08-11 MED ORDER — SODIUM CHLORIDE 0.9% FLUSH
3.0000 mL | Freq: Two times a day (BID) | INTRAVENOUS | Status: DC
  Administered 2019-08-11 – 2019-08-12 (×2): 3 mL via INTRAVENOUS

## 2019-08-11 MED ORDER — IOHEXOL 300 MG/ML  SOLN
100.0000 mL | Freq: Once | INTRAMUSCULAR | Status: AC | PRN
  Administered 2019-08-11: 12:00:00 75 mL via INTRA_ARTERIAL

## 2019-08-11 MED ORDER — IBUPROFEN 400 MG PO TABS
800.0000 mg | ORAL_TABLET | Freq: Four times a day (QID) | ORAL | Status: DC
  Administered 2019-08-11 – 2019-08-12 (×3): 800 mg via ORAL
  Filled 2019-08-11 (×3): qty 2

## 2019-08-11 MED ORDER — FENTANYL CITRATE (PF) 100 MCG/2ML IJ SOLN
INTRAMUSCULAR | Status: AC | PRN
  Administered 2019-08-11 (×4): 50 ug via INTRAVENOUS

## 2019-08-11 MED ORDER — BISACODYL 5 MG PO TBEC: 5.0000 mg | DELAYED_RELEASE_TABLET | Freq: Every day | ORAL | Status: DC | PRN

## 2019-08-11 MED ORDER — CEFAZOLIN SODIUM-DEXTROSE 2-4 GM/100ML-% IV SOLN
INTRAVENOUS | Status: AC
  Administered 2019-08-11: 2 g via INTRAVENOUS
  Filled 2019-08-11: qty 100

## 2019-08-11 MED ORDER — MIDAZOLAM HCL 2 MG/2ML IJ SOLN
INTRAMUSCULAR | Status: AC
  Filled 2019-08-11: qty 6

## 2019-08-11 MED ORDER — KETOROLAC TROMETHAMINE 30 MG/ML IJ SOLN
30.0000 mg | Freq: Four times a day (QID) | INTRAMUSCULAR | Status: DC | PRN
  Administered 2019-08-12 (×2): 30 mg via INTRAVENOUS
  Filled 2019-08-11 (×2): qty 1

## 2019-08-11 MED ORDER — SODIUM CHLORIDE 0.9% FLUSH: 9.0000 mL | INTRAVENOUS | Status: DC | PRN

## 2019-08-11 NOTE — Progress Notes (Signed)
2 cc's of air removed at 1322.

## 2019-08-11 NOTE — Progress Notes (Signed)
2 cc's of air removed at 1337.

## 2019-08-11 NOTE — Procedures (Signed)
  Procedure: Uterine fibroid embolization  Via L radial art EBL:   minimal Complications:  none immediate  See full dictation in [facility].  Dillard Cannon MD Main # [phone] Pager  [phone]

## 2019-08-11 NOTE — Progress Notes (Signed)
2 cc's of air removed, band removed and tegaderm was placed over site.

## 2019-08-11 NOTE — Progress Notes (Signed)
MD notified about blood pressure.  No orders given.

## 2019-08-11 NOTE — Sedation Documentation (Addendum)
TR band with 13 cc of air for hemostasis

## 2019-08-11 NOTE — Progress Notes (Signed)
2 cc's of air removed at 1354

## 2019-08-11 NOTE — H&P (Signed)
Chief Complaint: Symptomatic uterine fibroids  Referring Physician(s): Cousins,Sheronette  Supervising Physician: Arne Cleveland  Patient Status: [facility]  History of Present Illness: Elizabeth Skinner is a 38 y.o. female who was seen via telehealth visit by Dr. Vernard Gambles on 06/01/2019 for evaluation for uterine fibroid embolization.  She was diagnosed with uterine fibroids in the fall 2019.    She was noted to be markedly anemic and was evaluated by Dr. Alen Blew.    She complains of heavy  menstrual periods lasting up to 5 days and also has bleeding between periods.  She is G0, P0 with desire to maintain fertility.  Past Medical History:  Diagnosis Date  . Anemia   . Fibroids   . Iron deficiency   . Pre-diabetes     Past Surgical History:  Procedure Laterality Date  . CHOLECYSTECTOMY    . IR RADIOLOGIST EVAL & MGMT  06/01/2019    Allergies: Patient has no known allergies.  Medications: Prior to Admission medications   Medication Sig Start Date End Date Taking? Authorizing Provider  BIOTIN PO Take by mouth daily.     [provider]  CYANOCOBALAMIN PO Take by mouth daily.     [provider]  ferrous sulfate 325 (65 FE) MG EC tablet Take 1 tablet (325 mg total) by mouth daily with breakfast. 08/11/18   Wyatt Portela, MD  Prenatal Vit-Fe Fumarate-FA (PRENATAL MULTIVITAMIN) TABS tablet Take 1 tablet by mouth daily at 12 noon.    [provider]  Vitamin D, Ergocalciferol, (DRISDOL) 1.25 MG (50000 UT) CAPS capsule Take 1 capsule (50,000 Units total) by mouth every 7 (seven) days. 10/12/18   Georgia Lopes, DO     Family History  Problem Relation Age of Onset  . Diabetes Mother   . Obesity Mother     Social History   Socioeconomic History  . Marital status: Single    Spouse name: Not on file  . Number of children: Not on file  . Years of education: Not on file  . Highest education level: Not on file  Occupational History  .  Occupation: [employer] Professor  Tobacco Use  . Smoking status: Never Smoker  . Smokeless tobacco: Never Used  Substance and Sexual Activity  . Alcohol use: Not on file  . Drug use: Not on file  . Sexual activity: Not on file  Other Topics Concern  . Not on file  Social History Narrative  . Not on file   Social Determinants of Health   Financial Resource Strain:   . Difficulty of Paying Living Expenses: Not on file  Food Insecurity:   . Worried About Charity fundraiser in the Last Year: Not on file  . Ran Out of Food in the Last Year: Not on file  Transportation Needs:   . Lack of Transportation (Medical): Not on file  . Lack of Transportation (Non-Medical): Not on file  Physical Activity:   . Days of Exercise per Week: Not on file  . Minutes of Exercise per Session: Not on file  Stress:   . Feeling of Stress : Not on file  Social Connections:   . Frequency of Communication with Friends and Family: Not on file  . Frequency of Social Gatherings with Friends and Family: Not on file  . Attends Religious Services: Not on file  . Active Member of Clubs or Organizations: Not on file  . Attends Archivist Meetings: Not on file  .  Marital Status: Not on file     Review of Systems: A 12 point ROS discussed and pertinent positives are indicated in the HPI above.  All other systems are negative.  Review of Systems  Vital Signs: Ht 5\' 5"  (1.651 m)   Wt (!) 172.1 kg   LMP 08/06/2019   BMI 63.14 kg/m   Physical Exam Vitals reviewed.  Constitutional:      Appearance: She is obese.  HENT:     Head: Normocephalic and atraumatic.  Eyes:     Extraocular Movements: Extraocular movements intact.  Cardiovascular:     Rate and Rhythm: Normal rate and regular rhythm.  Pulmonary:     Effort: Pulmonary effort is normal. No respiratory distress.     Breath sounds: Normal breath sounds.  Abdominal:     General: There is no distension.     Palpations: Abdomen is soft.       Tenderness: There is no abdominal tenderness.  Musculoskeletal:        General: Normal range of motion.  Skin:    General: Skin is warm and dry.  Neurological:     General: No focal deficit present.     Mental Status: She is alert and oriented to person, place, and time.  Psychiatric:        Mood and Affect: Mood normal.        Behavior: Behavior normal.        Thought Content: Thought content normal.        Judgment: Judgment normal.     Imaging: CLINICAL DATA:  Symptomatic uterine fibroids. Pre uterine artery embolization.  EXAM: MRI PELVIS WITHOUT AND WITH CONTRAST  TECHNIQUE: Multiplanar multisequence MR imaging of the pelvis was performed both before and after administration of intravenous contrast.  CONTRAST:  13mL MULTIHANCE GADOBENATE DIMEGLUMINE 529 MG/ML IV SOLN  COMPARISON:  None.  FINDINGS: Urinary Tract: The bladder is unremarkable. No mass or bladder calculi.  Bowel:  The visualized colon and small bowel loops are unremarkable.  Vascular/Lymphatic: Normal appearing pelvic vasculature. No pelvic adenopathy the.  Reproductive: Enlarged fibroid uterus. The uterus measures 10.5 x 7.8 x 7.6 cm with near in volume of 311 cubic cm.  The largest fibroid is in the posterior myometrium and measures 5.5 x 4.5 x 4.8 cm with volume of 60 cubic cm. This fibroid demonstrates slight increased T2 signal intensity suggesting mucinous degeneration. There is mild mass effect on the endometrium by this fibroid but the endometrial cavity appears normal. The junctional zone appears normal. Multiple other fibroids demonstrate typical low T2 signal intensity. All of the fibroids demonstrate some degree of enhancement after contrast administration.  The cervix is unremarkable.  Both ovaries are normal.  Other: No free pelvic fluid collections. No inguinal mass or adenopathy.  Musculoskeletal: The bony structures are unremarkable.  IMPRESSION: 1.  Enlarged fibroid uterus with the largest fibroid demonstrating probable mucinous degeneration. Mild mass effect on the endometrial cavity but no serosal or submucosal fibroids. All of the fibroids demonstrate some degree of enhancement after contrast administration. 2. Normal ovaries.   Electronically Signed   By: Marijo Sanes M.D.   On: 06/25/2019 10:07   Labs:  CBC: Recent Labs    08/11/18 1437 02/16/19 0841 08/11/19 0845  WBC 7.5 7.2 6.8  HGB 8.3* 12.5 13.1  HCT 28.6* 38.0 40.7  PLT 594* 358 411*    COAGS: Recent Labs    08/11/19 0845  INR 1.0    BMP: Recent Labs  09/24/18 1426  NA 138  K 4.7  CL 99  CO2 25  GLUCOSE 83  BUN 11  CALCIUM 9.3  CREATININE 0.93  GFRNONAA 78  GFRAA 90    LIVER FUNCTION TESTS: Recent Labs    09/24/18 1426  BILITOT 0.3  AST 14  ALT 15  ALKPHOS 81  PROT 7.2  ALBUMIN 4.0    TUMOR MARKERS: No results for input(s): AFPTM, CEA, CA199, CHROMGRNA in the last 8760 hours.  Assessment and Plan:  Symptomatic uterine fibroids.  Dr. Vernard Gambles had a long discussion with her including risks and benefits of uterine fibroid embolization and she is ready to proceed.  The Risks and benefits of embolization were discussed with the patient including, but not limited to bleeding, infection, vascular injury, post operative pain, or contrast induced renal failure.  This procedure involves the use of X-rays and because of the nature of the planned procedure, it is possible that we will have prolonged use of X-ray fluoroscopy.  Potential radiation risks to you include (but are not limited to) the following: - A slightly elevated risk for cancer several years later in life. This risk is typically less than 0.5% percent. This risk is low in comparison to the normal incidence of human cancer, which is 33% for women and 50% for men according to the [employer]. - Radiation induced injury can include skin redness,  resembling a rash, tissue breakdown / ulcers and hair loss (which can be temporary or permanent).  The likelihood of either of these occurring depends on the difficulty of the procedure and whether you are sensitive to radiation due to previous procedures, disease, or genetic conditions.  IF your procedure requires a prolonged use of radiation, you will be notified and given written instructions for further action. It is your responsibility to monitor the irradiated area for the 2 weeks following the procedure and to notify your physician if you are concerned that you have suffered a radiation induced injury.   All of the patient's questions were answered, patient is agreeable to proceed. Consent signed and in chart.  Thank you for this interesting consult.  I greatly enjoyed meeting Elizabeth Skinner and look forward to participating in their care.  A copy of this report was sent to the requesting provider on this date.  Electronically Signed: Murrell Redden, PA-C   08/11/2019, 9:20 AM      I spent a total of    25 Minutes in face to face in clinical consultation, greater than 50% of which was counseling/coordinating care for uterine fibroid embolization.

## 2019-08-11 NOTE — Progress Notes (Signed)
3 cc's of air removed in recovery at 1245. I removed 2 cc of air at 1302.

## 2019-08-12 ENCOUNTER — Other Ambulatory Visit: Payer: Self-pay | Admitting: Oncology

## 2019-08-12 DIAGNOSIS — D259 Leiomyoma of uterus, unspecified: Secondary | ICD-10-CM | POA: Diagnosis not present

## 2019-08-12 MED ORDER — OXYCODONE-ACETAMINOPHEN 5-325 MG PO TABS
1.0000 | ORAL_TABLET | Freq: Four times a day (QID) | ORAL | Status: DC | PRN
  Administered 2019-08-12: 1 via ORAL
  Filled 2019-08-12: qty 1

## 2019-08-12 MED ORDER — IBUPROFEN 600 MG PO TABS: 600.0000 mg | ORAL_TABLET | Freq: Four times a day (QID) | ORAL | 0 refills | Status: DC

## 2019-08-12 MED ORDER — OXYCODONE-ACETAMINOPHEN 5-325 MG PO TABS: 1.0000 | ORAL_TABLET | ORAL | 0 refills | Status: DC | PRN

## 2019-08-12 MED ORDER — ONDANSETRON HCL 8 MG PO TABS: 8.0000 mg | ORAL_TABLET | Freq: Three times a day (TID) | ORAL | 0 refills | Status: DC | PRN

## 2019-08-12 MED ORDER — DOCUSATE SODIUM 100 MG PO CAPS: 100.0000 mg | ORAL_CAPSULE | Freq: Every day | ORAL | 0 refills | Status: DC | PRN

## 2019-08-12 NOTE — Discharge Summary (Signed)
Patient ID: Elizabeth Skinner MRN: [id number] DOB/AGE: [birth year] 39 y.o.  Admit date: 08/11/2019 Discharge date: 08/12/2019  Supervising Physician: Markus Daft  Patient Status: [facility] - In-pt  Admission Diagnoses: Fibroid uterus Fibroid  Discharge Diagnoses:  Active Problems:   Fibroid uterus   Fibroid   Discharged Condition: stable  Hospital Course:  Patient presented to [facility] 08/11/2019 for an image-guided bilateral uterine artery embolization using micron Embospheres via left radial approach by Dr. Vernard Gambles. Procedure occurred without major complications and patient was transferred to floor in stable condition (VSS, left raidal incision stable) for overnight observation. No major events occurred overnight.  Patient awake and alert laying in bed. Complains of pelvic pain, rated 3/10 at this time. States that she feels pain is well managed on PO pain medications. Left radial incision stable. Plan to discharge home today and follow-up with Dr. Vernard Gambles in clinic for televisit in 4 weeks.  Discharge medication instructions as below: 1- Percocet 5/325 mg tablets, take one tablet by mouth once every 4 hours as needed for moderate-severe pelvic pain. 2- Ibuprofen 600 mg tablets, take one tablet by mouth once every 6 hours for 5 days, then once every 6 hours as needed for moderate-severe pelvic pain. 3- Zofran 8 mg tablets, take one tablet by mouth once every 8 hours as needed for nausea/vomiting. 4- Colace 100 mg tablets, take one tablet by mouth once daily for 7 days.   Consults: None  Significant Diagnostic Studies: IR Angiogram Pelvis Selective Or Supraselective  Result Date: 08/11/2019 CLINICAL DATA:  Symptomatic uterine fibroids if EXAM: EXAM BILATERAL UTERINE ARTERY EMBOLIZATION TECHNIQUE: The procedure, risks, benefits, and alternatives were explained to the patient. Questions regarding the procedure were encouraged and answered. The patient understands and consents to the  procedure. As antibiotic prophylaxis, cefazolin 2 g was ordered pre-procedure and administered intravenously within one hour of incision. Left radial artery entry site was determined under ultrasound. Skin site was marked, prepped with chlorhexidine, and draped in usual sterile fashion, and infiltrated locally with 1% lidocaine. Intravenous Fentanyl 226mcg and Versed 6mg  were administered as conscious sedation during continuous monitoring of the patient's level of consciousness and physiological / cardiorespiratory status by the radiology RN, with a total moderate sedation time of 104 minutes. Under real-time ultrasound guidance, the left radial artery was accessed at the wrist with a micropuncture needle sheath set using singlewall technique in a single pass. Ultrasound imaging documentation was saved. Intra-arterial nitroglycerin/verapamil/heparin cocktail administered. Coaxial 5 French angled angiographic catheter was used to selectively catheterize the left internal iliac artery for pelvic arteriography. A coaxial microcatheter was advanced with the 016 wire and used to selectively catheterize the left uterine artery. The microcatheter tip was positioned in the distal horizontal segment. Selective arteriogram confirms appropriate positioning. Distal branches of the left uterine artery were embolized with 500-700 micron Embospheres. Embolization continued until near stasis of flow was achieved. Microcatheter was withdrawn and a followup selective left iliac arteriogram was obtained. Attention was then directed to the right. The angiographic catheter would not advance easily distally into the internal iliac artery, attributed to spasm. This did not respond to repeat bolus of verapamil and nitroglycerin intra arterial through the sheath. Catheter exchanged for a 4 French glide catheter which similarly would not advance adequately distally. This was exchanged for a 4 French 100 cm glide C2 which was advanced to the  proximal abdominal aorta, and the coaxial 150 cm microcatheter with 016 wire advanced and used to selectively catheterize the right uterine artery.  Confirmatory arteriogram was performed. Right uterine artery branches were embolized with 500-700 micron Embospheres to near stasis of flow. A total of 4 vials of Embospheres were utilized for the case. Microcatheter and C2 catheter removed. After protocol intra arterial nitroglycerin/verapamil bolus, sheath was removed and hemostasis achieved radial band. Patient tolerated the procedure well. FLUOROSCOPY TIME:  25.7 minute; 99991111 mGy COMPLICATIONS: COMPLICATIONS none IMPRESSION: 1. Technically successful bilateral uterine artery embolization using 500-700 micron Embospheres. Electronically Signed   By: Lucrezia Europe M.D.   On: 08/11/2019 14:18   IR Angiogram Pelvis Selective Or Supraselective  Result Date: 08/11/2019 CLINICAL DATA:  Symptomatic uterine fibroids if EXAM: EXAM BILATERAL UTERINE ARTERY EMBOLIZATION TECHNIQUE: The procedure, risks, benefits, and alternatives were explained to the patient. Questions regarding the procedure were encouraged and answered. The patient understands and consents to the procedure. As antibiotic prophylaxis, cefazolin 2 g was ordered pre-procedure and administered intravenously within one hour of incision. Left radial artery entry site was determined under ultrasound. Skin site was marked, prepped with chlorhexidine, and draped in usual sterile fashion, and infiltrated locally with 1% lidocaine. Intravenous Fentanyl 254mcg and Versed 6mg  were administered as conscious sedation during continuous monitoring of the patient's level of consciousness and physiological / cardiorespiratory status by the radiology RN, with a total moderate sedation time of 104 minutes. Under real-time ultrasound guidance, the left radial artery was accessed at the wrist with a micropuncture needle sheath set using singlewall technique in a single pass.  Ultrasound imaging documentation was saved. Intra-arterial nitroglycerin/verapamil/heparin cocktail administered. Coaxial 5 French angled angiographic catheter was used to selectively catheterize the left internal iliac artery for pelvic arteriography. A coaxial microcatheter was advanced with the 016 wire and used to selectively catheterize the left uterine artery. The microcatheter tip was positioned in the distal horizontal segment. Selective arteriogram confirms appropriate positioning. Distal branches of the left uterine artery were embolized with 500-700 micron Embospheres. Embolization continued until near stasis of flow was achieved. Microcatheter was withdrawn and a followup selective left iliac arteriogram was obtained. Attention was then directed to the right. The angiographic catheter would not advance easily distally into the internal iliac artery, attributed to spasm. This did not respond to repeat bolus of verapamil and nitroglycerin intra arterial through the sheath. Catheter exchanged for a 4 French glide catheter which similarly would not advance adequately distally. This was exchanged for a 4 French 100 cm glide C2 which was advanced to the proximal abdominal aorta, and the coaxial 150 cm microcatheter with 016 wire advanced and used to selectively catheterize the right uterine artery. Confirmatory arteriogram was performed. Right uterine artery branches were embolized with 500-700 micron Embospheres to near stasis of flow. A total of 4 vials of Embospheres were utilized for the case. Microcatheter and C2 catheter removed. After protocol intra arterial nitroglycerin/verapamil bolus, sheath was removed and hemostasis achieved radial band. Patient tolerated the procedure well. FLUOROSCOPY TIME:  25.7 minute; 99991111 mGy COMPLICATIONS: COMPLICATIONS none IMPRESSION: 1. Technically successful bilateral uterine artery embolization using 500-700 micron Embospheres. Electronically Signed   By: Lucrezia Europe  M.D.   On: 08/11/2019 14:18   IR Angiogram Selective Each Additional Vessel  Result Date: 08/11/2019 CLINICAL DATA:  Symptomatic uterine fibroids if EXAM: EXAM BILATERAL UTERINE ARTERY EMBOLIZATION TECHNIQUE: The procedure, risks, benefits, and alternatives were explained to the patient. Questions regarding the procedure were encouraged and answered. The patient understands and consents to the procedure. As antibiotic prophylaxis, cefazolin 2 g was ordered pre-procedure and administered intravenously within one  hour of incision. Left radial artery entry site was determined under ultrasound. Skin site was marked, prepped with chlorhexidine, and draped in usual sterile fashion, and infiltrated locally with 1% lidocaine. Intravenous Fentanyl 288mcg and Versed 6mg  were administered as conscious sedation during continuous monitoring of the patient's level of consciousness and physiological / cardiorespiratory status by the radiology RN, with a total moderate sedation time of 104 minutes. Under real-time ultrasound guidance, the left radial artery was accessed at the wrist with a micropuncture needle sheath set using singlewall technique in a single pass. Ultrasound imaging documentation was saved. Intra-arterial nitroglycerin/verapamil/heparin cocktail administered. Coaxial 5 French angled angiographic catheter was used to selectively catheterize the left internal iliac artery for pelvic arteriography. A coaxial microcatheter was advanced with the 016 wire and used to selectively catheterize the left uterine artery. The microcatheter tip was positioned in the distal horizontal segment. Selective arteriogram confirms appropriate positioning. Distal branches of the left uterine artery were embolized with 500-700 micron Embospheres. Embolization continued until near stasis of flow was achieved. Microcatheter was withdrawn and a followup selective left iliac arteriogram was obtained. Attention was then directed to the  right. The angiographic catheter would not advance easily distally into the internal iliac artery, attributed to spasm. This did not respond to repeat bolus of verapamil and nitroglycerin intra arterial through the sheath. Catheter exchanged for a 4 French glide catheter which similarly would not advance adequately distally. This was exchanged for a 4 French 100 cm glide C2 which was advanced to the proximal abdominal aorta, and the coaxial 150 cm microcatheter with 016 wire advanced and used to selectively catheterize the right uterine artery. Confirmatory arteriogram was performed. Right uterine artery branches were embolized with 500-700 micron Embospheres to near stasis of flow. A total of 4 vials of Embospheres were utilized for the case. Microcatheter and C2 catheter removed. After protocol intra arterial nitroglycerin/verapamil bolus, sheath was removed and hemostasis achieved radial band. Patient tolerated the procedure well. FLUOROSCOPY TIME:  25.7 minute; 99991111 mGy COMPLICATIONS: COMPLICATIONS none IMPRESSION: 1. Technically successful bilateral uterine artery embolization using 500-700 micron Embospheres. Electronically Signed   By: Lucrezia Europe M.D.   On: 08/11/2019 14:18   IR Angiogram Selective Each Additional Vessel  Result Date: 08/11/2019 CLINICAL DATA:  Symptomatic uterine fibroids if EXAM: EXAM BILATERAL UTERINE ARTERY EMBOLIZATION TECHNIQUE: The procedure, risks, benefits, and alternatives were explained to the patient. Questions regarding the procedure were encouraged and answered. The patient understands and consents to the procedure. As antibiotic prophylaxis, cefazolin 2 g was ordered pre-procedure and administered intravenously within one hour of incision. Left radial artery entry site was determined under ultrasound. Skin site was marked, prepped with chlorhexidine, and draped in usual sterile fashion, and infiltrated locally with 1% lidocaine. Intravenous Fentanyl 236mcg and Versed 6mg   were administered as conscious sedation during continuous monitoring of the patient's level of consciousness and physiological / cardiorespiratory status by the radiology RN, with a total moderate sedation time of 104 minutes. Under real-time ultrasound guidance, the left radial artery was accessed at the wrist with a micropuncture needle sheath set using singlewall technique in a single pass. Ultrasound imaging documentation was saved. Intra-arterial nitroglycerin/verapamil/heparin cocktail administered. Coaxial 5 French angled angiographic catheter was used to selectively catheterize the left internal iliac artery for pelvic arteriography. A coaxial microcatheter was advanced with the 016 wire and used to selectively catheterize the left uterine artery. The microcatheter tip was positioned in the distal horizontal segment. Selective arteriogram confirms appropriate positioning. Distal branches  of the left uterine artery were embolized with 500-700 micron Embospheres. Embolization continued until near stasis of flow was achieved. Microcatheter was withdrawn and a followup selective left iliac arteriogram was obtained. Attention was then directed to the right. The angiographic catheter would not advance easily distally into the internal iliac artery, attributed to spasm. This did not respond to repeat bolus of verapamil and nitroglycerin intra arterial through the sheath. Catheter exchanged for a 4 French glide catheter which similarly would not advance adequately distally. This was exchanged for a 4 French 100 cm glide C2 which was advanced to the proximal abdominal aorta, and the coaxial 150 cm microcatheter with 016 wire advanced and used to selectively catheterize the right uterine artery. Confirmatory arteriogram was performed. Right uterine artery branches were embolized with 500-700 micron Embospheres to near stasis of flow. A total of 4 vials of Embospheres were utilized for the case. Microcatheter and C2  catheter removed. After protocol intra arterial nitroglycerin/verapamil bolus, sheath was removed and hemostasis achieved radial band. Patient tolerated the procedure well. FLUOROSCOPY TIME:  25.7 minute; 99991111 mGy COMPLICATIONS: COMPLICATIONS none IMPRESSION: 1. Technically successful bilateral uterine artery embolization using 500-700 micron Embospheres. Electronically Signed   By: Lucrezia Europe M.D.   On: 08/11/2019 14:18   IR US Guide Vasc Access Left  Result Date: 08/11/2019 CLINICAL DATA:  Symptomatic uterine fibroids if EXAM: EXAM BILATERAL UTERINE ARTERY EMBOLIZATION TECHNIQUE: The procedure, risks, benefits, and alternatives were explained to the patient. Questions regarding the procedure were encouraged and answered. The patient understands and consents to the procedure. As antibiotic prophylaxis, cefazolin 2 g was ordered pre-procedure and administered intravenously within one hour of incision. Left radial artery entry site was determined under ultrasound. Skin site was marked, prepped with chlorhexidine, and draped in usual sterile fashion, and infiltrated locally with 1% lidocaine. Intravenous Fentanyl 269mcg and Versed 6mg  were administered as conscious sedation during continuous monitoring of the patient's level of consciousness and physiological / cardiorespiratory status by the radiology RN, with a total moderate sedation time of 104 minutes. Under real-time ultrasound guidance, the left radial artery was accessed at the wrist with a micropuncture needle sheath set using singlewall technique in a single pass. Ultrasound imaging documentation was saved. Intra-arterial nitroglycerin/verapamil/heparin cocktail administered. Coaxial 5 French angled angiographic catheter was used to selectively catheterize the left internal iliac artery for pelvic arteriography. A coaxial microcatheter was advanced with the 016 wire and used to selectively catheterize the left uterine artery. The microcatheter tip was  positioned in the distal horizontal segment. Selective arteriogram confirms appropriate positioning. Distal branches of the left uterine artery were embolized with 500-700 micron Embospheres. Embolization continued until near stasis of flow was achieved. Microcatheter was withdrawn and a followup selective left iliac arteriogram was obtained. Attention was then directed to the right. The angiographic catheter would not advance easily distally into the internal iliac artery, attributed to spasm. This did not respond to repeat bolus of verapamil and nitroglycerin intra arterial through the sheath. Catheter exchanged for a 4 French glide catheter which similarly would not advance adequately distally. This was exchanged for a 4 French 100 cm glide C2 which was advanced to the proximal abdominal aorta, and the coaxial 150 cm microcatheter with 016 wire advanced and used to selectively catheterize the right uterine artery. Confirmatory arteriogram was performed. Right uterine artery branches were embolized with 500-700 micron Embospheres to near stasis of flow. A total of 4 vials of Embospheres were utilized for the case. Microcatheter and C2  catheter removed. After protocol intra arterial nitroglycerin/verapamil bolus, sheath was removed and hemostasis achieved radial band. Patient tolerated the procedure well. FLUOROSCOPY TIME:  25.7 minute; 99991111 mGy COMPLICATIONS: COMPLICATIONS none IMPRESSION: 1. Technically successful bilateral uterine artery embolization using 500-700 micron Embospheres. Electronically Signed   By: Lucrezia Europe M.D.   On: 08/11/2019 14:18   IR EMBO TUMOR ORGAN ISCHEMIA INFARCT INC GUIDE ROADMAPPING  Result Date: 08/11/2019 CLINICAL DATA:  Symptomatic uterine fibroids if EXAM: EXAM BILATERAL UTERINE ARTERY EMBOLIZATION TECHNIQUE: The procedure, risks, benefits, and alternatives were explained to the patient. Questions regarding the procedure were encouraged and answered. The patient understands  and consents to the procedure. As antibiotic prophylaxis, cefazolin 2 g was ordered pre-procedure and administered intravenously within one hour of incision. Left radial artery entry site was determined under ultrasound. Skin site was marked, prepped with chlorhexidine, and draped in usual sterile fashion, and infiltrated locally with 1% lidocaine. Intravenous Fentanyl 256mcg and Versed 6mg  were administered as conscious sedation during continuous monitoring of the patient's level of consciousness and physiological / cardiorespiratory status by the radiology RN, with a total moderate sedation time of 104 minutes. Under real-time ultrasound guidance, the left radial artery was accessed at the wrist with a micropuncture needle sheath set using singlewall technique in a single pass. Ultrasound imaging documentation was saved. Intra-arterial nitroglycerin/verapamil/heparin cocktail administered. Coaxial 5 French angled angiographic catheter was used to selectively catheterize the left internal iliac artery for pelvic arteriography. A coaxial microcatheter was advanced with the 016 wire and used to selectively catheterize the left uterine artery. The microcatheter tip was positioned in the distal horizontal segment. Selective arteriogram confirms appropriate positioning. Distal branches of the left uterine artery were embolized with 500-700 micron Embospheres. Embolization continued until near stasis of flow was achieved. Microcatheter was withdrawn and a followup selective left iliac arteriogram was obtained. Attention was then directed to the right. The angiographic catheter would not advance easily distally into the internal iliac artery, attributed to spasm. This did not respond to repeat bolus of verapamil and nitroglycerin intra arterial through the sheath. Catheter exchanged for a 4 French glide catheter which similarly would not advance adequately distally. This was exchanged for a 4 French 100 cm glide C2 which  was advanced to the proximal abdominal aorta, and the coaxial 150 cm microcatheter with 016 wire advanced and used to selectively catheterize the right uterine artery. Confirmatory arteriogram was performed. Right uterine artery branches were embolized with 500-700 micron Embospheres to near stasis of flow. A total of 4 vials of Embospheres were utilized for the case. Microcatheter and C2 catheter removed. After protocol intra arterial nitroglycerin/verapamil bolus, sheath was removed and hemostasis achieved radial band. Patient tolerated the procedure well. FLUOROSCOPY TIME:  25.7 minute; 99991111 mGy COMPLICATIONS: COMPLICATIONS none IMPRESSION: 1. Technically successful bilateral uterine artery embolization using 500-700 micron Embospheres. Electronically Signed   By: Lucrezia Europe M.D.   On: 08/11/2019 14:18    Treatments: Bilateral uterine artery embolization using micron Embospheres.  Discharge Exam: Blood pressure (!) 160/85, pulse 72, temperature 98.9 F (37.2 C), temperature source Oral, resp. rate 20, height 5\' 5"  (1.651 m), weight (!) 379 lb 6.4 oz (172.1 kg), last menstrual period 08/06/2019, SpO2 94 %. Physical Exam Vitals and nursing note reviewed.  Constitutional:      General: She is not in acute distress.    Appearance: Normal appearance.  Cardiovascular:     Rate and Rhythm: Normal rate and regular rhythm.     Heart sounds:  Normal heart sounds. No murmur.  Pulmonary:     Effort: Pulmonary effort is normal. No respiratory distress.     Breath sounds: Normal breath sounds. No wheezing.  Skin:    General: Skin is warm and dry.     Comments: Right radial incision soft without active bleeding or hematoma. Radial pulse 2+ bilaterally.  Neurological:     Mental Status: She is alert and oriented to person, place, and time.  Psychiatric:        Mood and Affect: Mood normal.        Behavior: Behavior normal.     Disposition: Discharge disposition: 01-Home or Self  Care       Discharge Instructions    Call MD for:  difficulty breathing, headache or visual disturbances   Complete by: As directed    Call MD for:  extreme fatigue   Complete by: As directed    Call MD for:  hives   Complete by: As directed    Call MD for:  persistant dizziness or light-headedness   Complete by: As directed    Call MD for:  persistant nausea and vomiting   Complete by: As directed    Call MD for:  redness, tenderness, or signs of infection (pain, swelling, redness, odor or green/yellow discharge around incision site)   Complete by: As directed    Call MD for:  severe uncontrolled pain   Complete by: As directed    Call MD for:  temperature >100.4   Complete by: As directed    Diet - low sodium heart healthy   Complete by: As directed    Discharge instructions   Complete by: As directed    Ok to shower today. Do not submerge (swimming, bathing) for 7 days.     Allergies as of 08/12/2019   No Known Allergies     Medication List    STOP taking these medications   ferrous sulfate 325 (65 FE) MG EC tablet Replaced by: ferrous sulfate 325 (65 FE) MG tablet     TAKE these medications   BIOTIN PO Take 1 tablet by mouth daily.   CYANOCOBALAMIN PO Take 1 tablet by mouth daily.   DAYQUIL PO Take 1 capsule by mouth 2 (two) times daily as needed (congestion, cold symptoms).   docusate sodium 100 MG capsule Commonly known as: Colace Take 1 capsule (100 mg total) by mouth daily as needed for mild constipation or moderate constipation.   ferrous sulfate 325 (65 FE) MG tablet TAKE 1 TABLET BY MOUTH EVERY DAY WITH BREAKFAST Replaces: ferrous sulfate 325 (65 FE) MG EC tablet   ibuprofen 600 MG tablet Commonly known as: ADVIL Take 1 tablet (600 mg total) by mouth every 6 (six) hours. For 5 days, then as needed for abdominal pain.   loratadine-pseudoephedrine 5-120 MG tablet Commonly known as: CLARITIN-D 12-hour Take 1 tablet by mouth 2 (two) times  daily as needed for allergies.   ondansetron 8 MG tablet Commonly known as: Zofran Take 1 tablet (8 mg total) by mouth every 8 (eight) hours as needed for nausea or vomiting.   oxyCODONE-acetaminophen 5-325 MG tablet Commonly known as: PERCOCET/ROXICET Take 1 tablet by mouth every 4 (four) hours as needed for moderate pain or severe pain.   prenatal multivitamin Tabs tablet Take 1 tablet by mouth daily at 12 noon.   VITAMIN D PO Take 1 tablet by mouth daily.      Follow-up Information    Arne Cleveland, MD Follow  up in 4 week(s).   Specialties: Interventional Radiology, Radiology Why: Please plan to follow-up with Dr. Vernard Gambles for televisit 4 weeks after discharge. Our office will call you to set up this appointment. Contact information: [place] [address] [phone]            Electronically Signed: Earley Abide, PA-C 08/12/2019, 11:35 AM   I have spent Greater Than 30 Minutes discharging Kathy Breach.

## 2019-08-12 NOTE — Progress Notes (Signed)
D/C instructions given to patient. Patient had no questions. NT or writer will wheel patient out once family comes to pick her up  

## 2019-08-18 ENCOUNTER — Ambulatory Visit: Payer: BC Managed Care – PPO | Admitting: Oncology

## 2019-08-18 ENCOUNTER — Other Ambulatory Visit: Payer: BC Managed Care – PPO

## 2019-09-08 ENCOUNTER — Other Ambulatory Visit: Payer: Self-pay

## 2019-09-08 DIAGNOSIS — D649 Anemia, unspecified: Secondary | ICD-10-CM

## 2019-09-09 ENCOUNTER — Ambulatory Visit
Admission: RE | Admit: 2019-09-09 | Discharge: 2019-09-09 | Disposition: A | Discharge status: Home / Self Care | Payer: BC Managed Care – PPO | Source: Ambulatory Visit | Attending: Student | Admitting: Student

## 2019-09-09 ENCOUNTER — Other Ambulatory Visit: Payer: Self-pay

## 2019-09-09 ENCOUNTER — Telehealth: Payer: Self-pay | Admitting: Oncology

## 2019-09-09 ENCOUNTER — Inpatient Hospital Stay: Payer: BC Managed Care – PPO | Attending: Oncology

## 2019-09-09 ENCOUNTER — Inpatient Hospital Stay: Payer: BC Managed Care – PPO | Admitting: Oncology

## 2019-09-09 ENCOUNTER — Other Ambulatory Visit: Payer: Self-pay | Admitting: Interventional Radiology

## 2019-09-09 ENCOUNTER — Encounter: Payer: Self-pay | Admitting: *Deleted

## 2019-09-09 VITALS — BP 149/94 | HR 75 | Temp 98.0°F | Resp 17 | Ht 65.0 in | Wt 381.9 lb

## 2019-09-09 DIAGNOSIS — N920 Excessive and frequent menstruation with regular cycle: Secondary | ICD-10-CM | POA: Diagnosis not present

## 2019-09-09 DIAGNOSIS — D573 Sickle-cell trait: Secondary | ICD-10-CM | POA: Diagnosis not present

## 2019-09-09 DIAGNOSIS — D649 Anemia, unspecified: Secondary | ICD-10-CM | POA: Diagnosis not present

## 2019-09-09 DIAGNOSIS — D500 Iron deficiency anemia secondary to blood loss (chronic): Secondary | ICD-10-CM | POA: Diagnosis not present

## 2019-09-09 DIAGNOSIS — Z9884 Bariatric surgery status: Secondary | ICD-10-CM | POA: Insufficient documentation

## 2019-09-09 DIAGNOSIS — D259 Leiomyoma of uterus, unspecified: Secondary | ICD-10-CM | POA: Diagnosis not present

## 2019-09-09 HISTORY — PX: IR RADIOLOGIST EVAL & MGMT: IMG5224

## 2019-09-09 LAB — CBC WITH DIFFERENTIAL (CANCER CENTER ONLY)
Abs Immature Granulocytes: 0.04 10*3/uL (ref 0.00–0.07)
Basophils Absolute: 0.1 10*3/uL (ref 0.0–0.1)
Basophils Relative: 1 %
Eosinophils Absolute: 0.2 10*3/uL (ref 0.0–0.5)
Eosinophils Relative: 2 %
HCT: 38.9 % (ref 36.0–46.0)
Hemoglobin: 12.7 g/dL (ref 12.0–15.0)
Immature Granulocytes: 1 %
Lymphocytes Relative: 24 %
Lymphs Abs: 1.9 10*3/uL (ref 0.7–4.0)
MCH: 26.2 pg (ref 26.0–34.0)
MCHC: 32.6 g/dL (ref 30.0–36.0)
MCV: 80.4 fL (ref 80.0–100.0)
Monocytes Absolute: 0.4 10*3/uL (ref 0.1–1.0)
Monocytes Relative: 5 %
Neutro Abs: 5.6 10*3/uL (ref 1.7–7.7)
Neutrophils Relative %: 67 %
Platelet Count: 406 10*3/uL — ABNORMAL HIGH (ref 150–400)
RBC: 4.84 MIL/uL (ref 3.87–5.11)
RDW: 12.4 % (ref 11.5–15.5)
WBC Count: 8.2 10*3/uL (ref 4.0–10.5)
nRBC: 0 % (ref 0.0–0.2)

## 2019-09-09 LAB — IRON AND TIBC
Iron: 103 ug/dL (ref 41–142)
Saturation Ratios: 35 % (ref 21–57)
TIBC: 298 ug/dL (ref 236–444)
UIBC: 194 ug/dL (ref 120–384)

## 2019-09-09 LAB — FERRITIN: Ferritin: 28 ng/mL (ref 11–307)

## 2019-09-09 NOTE — Progress Notes (Signed)
Patient ID: Elizabeth Skinner, female   DOB: [birth year], 39 y.o.   MRN: [id number]       Chief Complaint: Patient was consulted remotely today ([place]) for follow-up uterine fibroid embolization at the request of Louk,Alexandra M.    Referring Physician(s): Cousins,Sheronette  History of Present Illness: Elizabeth Skinner is a 39 y.o. female  who was diagnosed with uterine fibroids in the fall 2019.  She was noted to be markedly anemic and was evaluated by Dr. Alen Blew.  She responded nicely to Feraheme infusion. Heavy  menstrual periods lasting up to 5 days, with some inter-period Bleeding. No previous fibroid surgeries.  She is G0, P0 with desire to maintain fertility. Underwent uncomplicated bilateral uterine artery embolization the radial approach 08/11/2019. She was discharged the following day in good condition.  She says she did well after discharge.  She stopped using her ibuprofen after about 4 days and was symptom-free.  She is returned to nearly full activity.  No unexpected findings or complications.  Past Medical History:  Diagnosis Date  . Anemia   . Fibroids   . Iron deficiency   . Pre-diabetes     Past Surgical History:  Procedure Laterality Date  . CHOLECYSTECTOMY    . IR ANGIOGRAM PELVIS SELECTIVE OR SUPRASELECTIVE  08/11/2019  . IR ANGIOGRAM PELVIS SELECTIVE OR SUPRASELECTIVE  08/11/2019  . IR ANGIOGRAM SELECTIVE EACH ADDITIONAL VESSEL  08/11/2019  . IR ANGIOGRAM SELECTIVE EACH ADDITIONAL VESSEL  08/11/2019  . IR EMBO TUMOR ORGAN ISCHEMIA INFARCT INC GUIDE ROADMAPPING  08/11/2019  . IR RADIOLOGIST EVAL & MGMT  06/01/2019  . IR RADIOLOGIST EVAL & MGMT  09/09/2019  . IR US GUIDE VASC ACCESS LEFT  08/11/2019    Allergies: Patient has no known allergies.  Medications: Prior to Admission medications   Medication Sig Start Date End Date Taking? Authorizing Provider  BIOTIN PO Take 1 tablet by mouth daily.     [provider]  CYANOCOBALAMIN PO Take 1 tablet by  mouth daily.     [provider]  docusate sodium (COLACE) 100 MG capsule Take 1 capsule (100 mg total) by mouth daily as needed for mild constipation or moderate constipation. Patient not taking: Reported on 09/09/2019 08/12/19   Ronney Lion, PA-C  ferrous sulfate 325 (65 FE) MG tablet TAKE 1 TABLET BY MOUTH EVERY DAY WITH BREAKFAST 08/12/19   Wyatt Portela, MD  ibuprofen (ADVIL) 600 MG tablet Take 1 tablet (600 mg total) by mouth every 6 (six) hours. For 5 days, then as needed for abdominal pain. Patient not taking: Reported on 09/09/2019 08/12/19   Ronney Lion, PA-C  loratadine-pseudoephedrine (CLARITIN-D 12-HOUR) 5-120 MG tablet Take 1 tablet by mouth 2 (two) times daily as needed for allergies.    [provider]  ondansetron (ZOFRAN) 8 MG tablet Take 1 tablet (8 mg total) by mouth every 8 (eight) hours as needed for nausea or vomiting. Patient not taking: Reported on 09/09/2019 08/12/19   Ronney Lion, PA-C  oxyCODONE-acetaminophen (PERCOCET/ROXICET) 5-325 MG tablet Take 1 tablet by mouth every 4 (four) hours as needed for moderate pain or severe pain. Patient not taking: Reported on 09/09/2019 08/12/19   Ronney Lion, PA-C  Prenatal Vit-Fe Fumarate-FA (PRENATAL MULTIVITAMIN) TABS tablet Take 1 tablet by mouth daily at 12 noon.    [provider]  Pseudoephedrine-APAP-DM (DAYQUIL PO) Take 1 capsule by mouth 2 (two) times daily as needed (congestion, cold symptoms).    [provider]  VITAMIN D  PO Take 1 tablet by mouth daily.    [provider]     Family History  Problem Relation Age of Onset  . Diabetes Mother   . Obesity Mother     Social History   Socioeconomic History  . Marital status: Single    Spouse name: Not on file  . Number of children: Not on file  . Years of education: Not on file  . Highest education level: Not on file  Occupational History  . Occupation: [employer] Professor  Tobacco Use  .  Smoking status: Never Smoker  . Smokeless tobacco: Never Used  Substance and Sexual Activity  . Alcohol use: Not on file  . Drug use: Not on file  . Sexual activity: Not on file  Other Topics Concern  . Not on file  Social History Narrative  . Not on file   Social Determinants of Health   Financial Resource Strain:   . Difficulty of Paying Living Expenses: Not on file  Food Insecurity:   . Worried About Charity fundraiser in the Last Year: Not on file  . Ran Out of Food in the Last Year: Not on file  Transportation Needs:   . Lack of Transportation (Medical): Not on file  . Lack of Transportation (Non-Medical): Not on file  Physical Activity:   . Days of Exercise per Week: Not on file  . Minutes of Exercise per Session: Not on file  Stress:   . Feeling of Stress : Not on file  Social Connections:   . Frequency of Communication with Friends and Family: Not on file  . Frequency of Social Gatherings with Friends and Family: Not on file  . Attends Religious Services: Not on file  . Active Member of Clubs or Organizations: Not on file  . Attends Archivist Meetings: Not on file  . Marital Status: Not on file    ECOG Status: 1 - Symptomatic but completely ambulatory  Review of Systems  Review of Systems: A 12 point ROS discussed and pertinent positives are indicated in the HPI above.  All other systems are negative.  Physical Exam No direct physical exam was performed (except for noted visual exam findings with Video Visits).    Vital Signs: There were no vitals taken for this visit.  Imaging: IR Angiogram Pelvis Selective Or Supraselective  Result Date: 08/11/2019 CLINICAL DATA:  Symptomatic uterine fibroids if EXAM: EXAM BILATERAL UTERINE ARTERY EMBOLIZATION TECHNIQUE: The procedure, risks, benefits, and alternatives were explained to the patient. Questions regarding the procedure were encouraged and answered. The patient understands and consents to the  procedure. As antibiotic prophylaxis, cefazolin 2 g was ordered pre-procedure and administered intravenously within one hour of incision. Left radial artery entry site was determined under ultrasound. Skin site was marked, prepped with chlorhexidine, and draped in usual sterile fashion, and infiltrated locally with 1% lidocaine. Intravenous Fentanyl 223mcg and Versed 6mg  were administered as conscious sedation during continuous monitoring of the patient's level of consciousness and physiological / cardiorespiratory status by the radiology RN, with a total moderate sedation time of 104 minutes. Under real-time ultrasound guidance, the left radial artery was accessed at the wrist with a micropuncture needle sheath set using singlewall technique in a single pass. Ultrasound imaging documentation was saved. Intra-arterial nitroglycerin/verapamil/heparin cocktail administered. Coaxial 5 French angled angiographic catheter was used to selectively catheterize the left internal iliac artery for pelvic arteriography. A coaxial microcatheter was advanced with the 016 wire and used to selectively  catheterize the left uterine artery. The microcatheter tip was positioned in the distal horizontal segment. Selective arteriogram confirms appropriate positioning. Distal branches of the left uterine artery were embolized with 500-700 micron Embospheres. Embolization continued until near stasis of flow was achieved. Microcatheter was withdrawn and a followup selective left iliac arteriogram was obtained. Attention was then directed to the right. The angiographic catheter would not advance easily distally into the internal iliac artery, attributed to spasm. This did not respond to repeat bolus of verapamil and nitroglycerin intra arterial through the sheath. Catheter exchanged for a 4 French glide catheter which similarly would not advance adequately distally. This was exchanged for a 4 French 100 cm glide C2 which was advanced to the  proximal abdominal aorta, and the coaxial 150 cm microcatheter with 016 wire advanced and used to selectively catheterize the right uterine artery. Confirmatory arteriogram was performed. Right uterine artery branches were embolized with 500-700 micron Embospheres to near stasis of flow. A total of 4 vials of Embospheres were utilized for the case. Microcatheter and C2 catheter removed. After protocol intra arterial nitroglycerin/verapamil bolus, sheath was removed and hemostasis achieved radial band. Patient tolerated the procedure well. FLUOROSCOPY TIME:  25.7 minute; 99991111 mGy COMPLICATIONS: COMPLICATIONS none IMPRESSION: 1. Technically successful bilateral uterine artery embolization using 500-700 micron Embospheres. Electronically Signed   By: Lucrezia Europe M.D.   On: 08/11/2019 14:18   IR Angiogram Pelvis Selective Or Supraselective  Result Date: 08/11/2019 CLINICAL DATA:  Symptomatic uterine fibroids if EXAM: EXAM BILATERAL UTERINE ARTERY EMBOLIZATION TECHNIQUE: The procedure, risks, benefits, and alternatives were explained to the patient. Questions regarding the procedure were encouraged and answered. The patient understands and consents to the procedure. As antibiotic prophylaxis, cefazolin 2 g was ordered pre-procedure and administered intravenously within one hour of incision. Left radial artery entry site was determined under ultrasound. Skin site was marked, prepped with chlorhexidine, and draped in usual sterile fashion, and infiltrated locally with 1% lidocaine. Intravenous Fentanyl 234mcg and Versed 6mg  were administered as conscious sedation during continuous monitoring of the patient's level of consciousness and physiological / cardiorespiratory status by the radiology RN, with a total moderate sedation time of 104 minutes. Under real-time ultrasound guidance, the left radial artery was accessed at the wrist with a micropuncture needle sheath set using singlewall technique in a single pass.  Ultrasound imaging documentation was saved. Intra-arterial nitroglycerin/verapamil/heparin cocktail administered. Coaxial 5 French angled angiographic catheter was used to selectively catheterize the left internal iliac artery for pelvic arteriography. A coaxial microcatheter was advanced with the 016 wire and used to selectively catheterize the left uterine artery. The microcatheter tip was positioned in the distal horizontal segment. Selective arteriogram confirms appropriate positioning. Distal branches of the left uterine artery were embolized with 500-700 micron Embospheres. Embolization continued until near stasis of flow was achieved. Microcatheter was withdrawn and a followup selective left iliac arteriogram was obtained. Attention was then directed to the right. The angiographic catheter would not advance easily distally into the internal iliac artery, attributed to spasm. This did not respond to repeat bolus of verapamil and nitroglycerin intra arterial through the sheath. Catheter exchanged for a 4 French glide catheter which similarly would not advance adequately distally. This was exchanged for a 4 French 100 cm glide C2 which was advanced to the proximal abdominal aorta, and the coaxial 150 cm microcatheter with 016 wire advanced and used to selectively catheterize the right uterine artery. Confirmatory arteriogram was performed. Right uterine artery branches were embolized with 500-700  micron Embospheres to near stasis of flow. A total of 4 vials of Embospheres were utilized for the case. Microcatheter and C2 catheter removed. After protocol intra arterial nitroglycerin/verapamil bolus, sheath was removed and hemostasis achieved radial band. Patient tolerated the procedure well. FLUOROSCOPY TIME:  25.7 minute; 99991111 mGy COMPLICATIONS: COMPLICATIONS none IMPRESSION: 1. Technically successful bilateral uterine artery embolization using 500-700 micron Embospheres. Electronically Signed   By: Lucrezia Europe  M.D.   On: 08/11/2019 14:18   IR Angiogram Selective Each Additional Vessel  Result Date: 08/11/2019 CLINICAL DATA:  Symptomatic uterine fibroids if EXAM: EXAM BILATERAL UTERINE ARTERY EMBOLIZATION TECHNIQUE: The procedure, risks, benefits, and alternatives were explained to the patient. Questions regarding the procedure were encouraged and answered. The patient understands and consents to the procedure. As antibiotic prophylaxis, cefazolin 2 g was ordered pre-procedure and administered intravenously within one hour of incision. Left radial artery entry site was determined under ultrasound. Skin site was marked, prepped with chlorhexidine, and draped in usual sterile fashion, and infiltrated locally with 1% lidocaine. Intravenous Fentanyl 242mcg and Versed 6mg  were administered as conscious sedation during continuous monitoring of the patient's level of consciousness and physiological / cardiorespiratory status by the radiology RN, with a total moderate sedation time of 104 minutes. Under real-time ultrasound guidance, the left radial artery was accessed at the wrist with a micropuncture needle sheath set using singlewall technique in a single pass. Ultrasound imaging documentation was saved. Intra-arterial nitroglycerin/verapamil/heparin cocktail administered. Coaxial 5 French angled angiographic catheter was used to selectively catheterize the left internal iliac artery for pelvic arteriography. A coaxial microcatheter was advanced with the 016 wire and used to selectively catheterize the left uterine artery. The microcatheter tip was positioned in the distal horizontal segment. Selective arteriogram confirms appropriate positioning. Distal branches of the left uterine artery were embolized with 500-700 micron Embospheres. Embolization continued until near stasis of flow was achieved. Microcatheter was withdrawn and a followup selective left iliac arteriogram was obtained. Attention was then directed to the  right. The angiographic catheter would not advance easily distally into the internal iliac artery, attributed to spasm. This did not respond to repeat bolus of verapamil and nitroglycerin intra arterial through the sheath. Catheter exchanged for a 4 French glide catheter which similarly would not advance adequately distally. This was exchanged for a 4 French 100 cm glide C2 which was advanced to the proximal abdominal aorta, and the coaxial 150 cm microcatheter with 016 wire advanced and used to selectively catheterize the right uterine artery. Confirmatory arteriogram was performed. Right uterine artery branches were embolized with 500-700 micron Embospheres to near stasis of flow. A total of 4 vials of Embospheres were utilized for the case. Microcatheter and C2 catheter removed. After protocol intra arterial nitroglycerin/verapamil bolus, sheath was removed and hemostasis achieved radial band. Patient tolerated the procedure well. FLUOROSCOPY TIME:  25.7 minute; 99991111 mGy COMPLICATIONS: COMPLICATIONS none IMPRESSION: 1. Technically successful bilateral uterine artery embolization using 500-700 micron Embospheres. Electronically Signed   By: Lucrezia Europe M.D.   On: 08/11/2019 14:18   IR Angiogram Selective Each Additional Vessel  Result Date: 08/11/2019 CLINICAL DATA:  Symptomatic uterine fibroids if EXAM: EXAM BILATERAL UTERINE ARTERY EMBOLIZATION TECHNIQUE: The procedure, risks, benefits, and alternatives were explained to the patient. Questions regarding the procedure were encouraged and answered. The patient understands and consents to the procedure. As antibiotic prophylaxis, cefazolin 2 g was ordered pre-procedure and administered intravenously within one hour of incision. Left radial artery entry site was determined under ultrasound.  Skin site was marked, prepped with chlorhexidine, and draped in usual sterile fashion, and infiltrated locally with 1% lidocaine. Intravenous Fentanyl 298mcg and Versed 6mg   were administered as conscious sedation during continuous monitoring of the patient's level of consciousness and physiological / cardiorespiratory status by the radiology RN, with a total moderate sedation time of 104 minutes. Under real-time ultrasound guidance, the left radial artery was accessed at the wrist with a micropuncture needle sheath set using singlewall technique in a single pass. Ultrasound imaging documentation was saved. Intra-arterial nitroglycerin/verapamil/heparin cocktail administered. Coaxial 5 French angled angiographic catheter was used to selectively catheterize the left internal iliac artery for pelvic arteriography. A coaxial microcatheter was advanced with the 016 wire and used to selectively catheterize the left uterine artery. The microcatheter tip was positioned in the distal horizontal segment. Selective arteriogram confirms appropriate positioning. Distal branches of the left uterine artery were embolized with 500-700 micron Embospheres. Embolization continued until near stasis of flow was achieved. Microcatheter was withdrawn and a followup selective left iliac arteriogram was obtained. Attention was then directed to the right. The angiographic catheter would not advance easily distally into the internal iliac artery, attributed to spasm. This did not respond to repeat bolus of verapamil and nitroglycerin intra arterial through the sheath. Catheter exchanged for a 4 French glide catheter which similarly would not advance adequately distally. This was exchanged for a 4 French 100 cm glide C2 which was advanced to the proximal abdominal aorta, and the coaxial 150 cm microcatheter with 016 wire advanced and used to selectively catheterize the right uterine artery. Confirmatory arteriogram was performed. Right uterine artery branches were embolized with 500-700 micron Embospheres to near stasis of flow. A total of 4 vials of Embospheres were utilized for the case. Microcatheter and C2  catheter removed. After protocol intra arterial nitroglycerin/verapamil bolus, sheath was removed and hemostasis achieved radial band. Patient tolerated the procedure well. FLUOROSCOPY TIME:  25.7 minute; 99991111 mGy COMPLICATIONS: COMPLICATIONS none IMPRESSION: 1. Technically successful bilateral uterine artery embolization using 500-700 micron Embospheres. Electronically Signed   By: Lucrezia Europe M.D.   On: 08/11/2019 14:18   IR US Guide Vasc Access Left  Result Date: 08/11/2019 CLINICAL DATA:  Symptomatic uterine fibroids if EXAM: EXAM BILATERAL UTERINE ARTERY EMBOLIZATION TECHNIQUE: The procedure, risks, benefits, and alternatives were explained to the patient. Questions regarding the procedure were encouraged and answered. The patient understands and consents to the procedure. As antibiotic prophylaxis, cefazolin 2 g was ordered pre-procedure and administered intravenously within one hour of incision. Left radial artery entry site was determined under ultrasound. Skin site was marked, prepped with chlorhexidine, and draped in usual sterile fashion, and infiltrated locally with 1% lidocaine. Intravenous Fentanyl 249mcg and Versed 6mg  were administered as conscious sedation during continuous monitoring of the patient's level of consciousness and physiological / cardiorespiratory status by the radiology RN, with a total moderate sedation time of 104 minutes. Under real-time ultrasound guidance, the left radial artery was accessed at the wrist with a micropuncture needle sheath set using singlewall technique in a single pass. Ultrasound imaging documentation was saved. Intra-arterial nitroglycerin/verapamil/heparin cocktail administered. Coaxial 5 French angled angiographic catheter was used to selectively catheterize the left internal iliac artery for pelvic arteriography. A coaxial microcatheter was advanced with the 016 wire and used to selectively catheterize the left uterine artery. The microcatheter tip was  positioned in the distal horizontal segment. Selective arteriogram confirms appropriate positioning. Distal branches of the left uterine artery were embolized with 500-700 micron Embospheres.  Embolization continued until near stasis of flow was achieved. Microcatheter was withdrawn and a followup selective left iliac arteriogram was obtained. Attention was then directed to the right. The angiographic catheter would not advance easily distally into the internal iliac artery, attributed to spasm. This did not respond to repeat bolus of verapamil and nitroglycerin intra arterial through the sheath. Catheter exchanged for a 4 French glide catheter which similarly would not advance adequately distally. This was exchanged for a 4 French 100 cm glide C2 which was advanced to the proximal abdominal aorta, and the coaxial 150 cm microcatheter with 016 wire advanced and used to selectively catheterize the right uterine artery. Confirmatory arteriogram was performed. Right uterine artery branches were embolized with 500-700 micron Embospheres to near stasis of flow. A total of 4 vials of Embospheres were utilized for the case. Microcatheter and C2 catheter removed. After protocol intra arterial nitroglycerin/verapamil bolus, sheath was removed and hemostasis achieved radial band. Patient tolerated the procedure well. FLUOROSCOPY TIME:  25.7 minute; 99991111 mGy COMPLICATIONS: COMPLICATIONS none IMPRESSION: 1. Technically successful bilateral uterine artery embolization using 500-700 micron Embospheres. Electronically Signed   By: Lucrezia Europe M.D.   On: 08/11/2019 14:18   IR EMBO TUMOR ORGAN ISCHEMIA INFARCT INC GUIDE ROADMAPPING  Result Date: 08/11/2019 CLINICAL DATA:  Symptomatic uterine fibroids if EXAM: EXAM BILATERAL UTERINE ARTERY EMBOLIZATION TECHNIQUE: The procedure, risks, benefits, and alternatives were explained to the patient. Questions regarding the procedure were encouraged and answered. The patient understands  and consents to the procedure. As antibiotic prophylaxis, cefazolin 2 g was ordered pre-procedure and administered intravenously within one hour of incision. Left radial artery entry site was determined under ultrasound. Skin site was marked, prepped with chlorhexidine, and draped in usual sterile fashion, and infiltrated locally with 1% lidocaine. Intravenous Fentanyl 281mcg and Versed 6mg  were administered as conscious sedation during continuous monitoring of the patient's level of consciousness and physiological / cardiorespiratory status by the radiology RN, with a total moderate sedation time of 104 minutes. Under real-time ultrasound guidance, the left radial artery was accessed at the wrist with a micropuncture needle sheath set using singlewall technique in a single pass. Ultrasound imaging documentation was saved. Intra-arterial nitroglycerin/verapamil/heparin cocktail administered. Coaxial 5 French angled angiographic catheter was used to selectively catheterize the left internal iliac artery for pelvic arteriography. A coaxial microcatheter was advanced with the 016 wire and used to selectively catheterize the left uterine artery. The microcatheter tip was positioned in the distal horizontal segment. Selective arteriogram confirms appropriate positioning. Distal branches of the left uterine artery were embolized with 500-700 micron Embospheres. Embolization continued until near stasis of flow was achieved. Microcatheter was withdrawn and a followup selective left iliac arteriogram was obtained. Attention was then directed to the right. The angiographic catheter would not advance easily distally into the internal iliac artery, attributed to spasm. This did not respond to repeat bolus of verapamil and nitroglycerin intra arterial through the sheath. Catheter exchanged for a 4 French glide catheter which similarly would not advance adequately distally. This was exchanged for a 4 French 100 cm glide C2 which  was advanced to the proximal abdominal aorta, and the coaxial 150 cm microcatheter with 016 wire advanced and used to selectively catheterize the right uterine artery. Confirmatory arteriogram was performed. Right uterine artery branches were embolized with 500-700 micron Embospheres to near stasis of flow. A total of 4 vials of Embospheres were utilized for the case. Microcatheter and C2 catheter removed. After protocol intra arterial nitroglycerin/verapamil bolus,  sheath was removed and hemostasis achieved radial band. Patient tolerated the procedure well. FLUOROSCOPY TIME:  25.7 minute; 99991111 mGy COMPLICATIONS: COMPLICATIONS none IMPRESSION: 1. Technically successful bilateral uterine artery embolization using 500-700 micron Embospheres. Electronically Signed   By: Lucrezia Europe M.D.   On: 08/11/2019 14:18   IR Radiologist Eval & Mgmt  Result Date: 09/09/2019 Please refer to notes tab for details about interventional procedure. (Op Note)   Labs:  CBC: Recent Labs    02/16/19 0841 08/11/19 0845 09/09/19 0811  WBC 7.2 6.8 8.2  HGB 12.5 13.1 12.7  HCT 38.0 40.7 38.9  PLT 358 411* 406*    COAGS: Recent Labs    08/11/19 0845  INR 1.0    BMP: Recent Labs    09/24/18 1426 08/11/19 0845  NA 138 138  K 4.7 4.8  CL 99 104  CO2 25 24  GLUCOSE 83 117*  BUN 11 9  CALCIUM 9.3 8.9  CREATININE 0.93 0.94  GFRNONAA 78 >60  GFRAA 90 >60    LIVER FUNCTION TESTS: Recent Labs    09/24/18 1426  BILITOT 0.3  AST 14  ALT 15  ALKPHOS 81  PROT 7.2  ALBUMIN 4.0    TUMOR MARKERS: No results for input(s): AFPTM, CEA, CA199, CHROMGRNA in the last 8760 hours.  Assessment and Plan:  My impression is that this patient has done very well post uterine fibroid embolization.  She has very mild post embolization syndrome.  No complications with the radial access site.  She can return to full activity. We discussed that the fibroids will involute down to the final size after 3-6 months, and I  would expect improvement in symptoms over this timeframe.  No new symptoms are anticipated. I reiterated the need to contact me right away should she notice any tissue fragment passage which might suggest fibroid fragmentation, a risk for superinfection.  Otherwise, will follow up with her at the 81-month mark with a MRI to confirm successful devitalization and involution of all of the fibroids. She knows to call in the interval with any questions or problems.  Thank you for this interesting consult.  I greatly enjoyed meeting Paizleigh Hanahan and look forward to participating in their care.  A copy of this report was sent to the requesting provider on this date.  Electronically Signed: Rickard Rhymes 09/09/2019, 3:39 PM   I spent a total of    15 Minutes in remote  clinical consultation, greater than 50% of which was counseling/coordinating care for uterine fibroids, post embolization  Visit type: Audio and video [employer]).   Alternative for in-person consultation at [facility], [address] [place], [place], [place]. This visit type was conducted due to national recommendations for restrictions regarding the COVID-19 Pandemic (e.g. social distancing).  This format is felt to be most appropriate for this patient at this time.  All issues noted in this document were discussed and addressed.

## 2019-09-09 NOTE — Progress Notes (Signed)
Hematology and Oncology Follow Up Visit  Diaja On [id number] July 28, 1981 39 y.o. 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     Prior Therapy: He is status post Feraheme a total of 1020 mg total dose split with 2 weekly infusions completed on August 29, 2018. She is status post uterine fibroid embolization completed in December 2020.  Current therapy:   Interim History: Ms. Elizabeth Skinner returns today for a repeat evaluation.  Since the last visit, he underwent a uterine fibroid ablation in December without any complications at this time.  Is tolerated the procedure well without any postoperative pain or discomfort.  She denies any nausea, vomiting or abdominal pain.  She denies any hematochezia or melena.  She continues to take oral iron replacement without any major issues at this time.     Medications: Reviewed without any changes. Current Outpatient Medications  Medication Sig Dispense Refill  . BIOTIN PO Take 1 tablet by mouth daily.     . CYANOCOBALAMIN PO Take 1 tablet by mouth daily.     Marland Kitchen    0  . ferrous sulfate 325 (65 FE) MG tablet TAKE 1 TABLET BY MOUTH EVERY DAY WITH BREAKFAST 90 tablet 3  .      . loratadine-pseudoephedrine (CLARITIN-D 12-HOUR) 5-120 MG tablet Take 1 tablet by mouth 2 (two) times daily as needed for allergies.    .    0  .      . Prenatal Vit-Fe Fumarate-FA (PRENATAL MULTIVITAMIN) TABS tablet Take 1 tablet by mouth daily at 12 noon.    .      . VITAMIN D PO Take 1 tablet by mouth daily.     No current facility-administered medications for this visit.     Allergies: No Known Allergies  Past Medical History, Surgical history, Social history, and Family History updated and reviewed.    Physical Exam:  Blood pressure (!) 149/94, pulse 75, temperature 98 F (36.7 C),  temperature source Temporal, resp. rate 17, height 5\' 5"  (1.651 m), weight (!) 381 lb 14.4 oz (173.2 kg), SpO2 99 %.    ECOG:  0    General appearance: Alert, awake without any distress. Head: Atraumatic without abnormalities Oropharynx: Without any thrush or ulcers. Eyes: No scleral icterus. Lymph nodes: No lymphadenopathy noted in the cervical, supraclavicular, or axillary nodes Heart:regular rate and rhythm, without any murmurs or gallops.   Lung: Clear to auscultation without any rhonchi, wheezes or dullness to percussion. Abdomin: Soft, nontender without any shifting dullness or ascites. Musculoskeletal: No clubbing or cyanosis. Neurological: No motor or sensory deficits. Skin: No rashes or lesions. Psychiatric: Mood and affect appeared normal.     Lab Results: Lab Results  Component Value Date   WBC 6.8 08/11/2019   HGB 13.1 08/11/2019   HCT 40.7 08/11/2019   MCV 82.4 08/11/2019   PLT 411 (H) 08/11/2019     Chemistry      Component Value Date/Time   NA 138 08/11/2019 0845   NA 138 09/24/2018 1426   K 4.8 08/11/2019 0845   CL 104 08/11/2019 0845   CO2 24 08/11/2019 0845   BUN 9 08/11/2019 0845   BUN 11 09/24/2018 1426   CREATININE 0.94 08/11/2019 0845      Component Value Date/Time   CALCIUM 8.9 08/11/2019 0845   ALKPHOS 81 09/24/2018 1426  AST 14 09/24/2018 1426   ALT 15 09/24/2018 1426   BILITOT 0.3 09/24/2018 1426      Impression and Plan:  39 year old woman with:  1.  Iron deficiency anemia diagnosed in 2019.  Her anemia is related to chronic menstrual blood losses and poor iron absorption due to previous gastric bypass surgery.  Iron studies obtained in June 2020 showed normal iron level with improvement in her ferritin.  Her hemoglobin continues to improve in December was up to 13.1.  Her hemoglobin today is normal and iron studies are currently pending.  Treatment options were reviewed today including continuing oral iron maintenance therapy  versus repeat intravenous iron was discussed.  For the time being I have recommended continuing oral iron replacement at this time.  2.  Chronic menstrual blood losses: Related to uterine fibroids which has been embolized at this time.    3.  Sickle cell trait: No implication not affecting her counts at this time.  Her microcytosis is predominantly related to iron deficiency.  4.  Follow-up: Will be in 6 months for repeat evaluation.  30  minutes was spent on this encounter.  The time was dedicated to reviewing laboratory data, discussing disease status update, treatment options as well as answering questions regarding future plan of care.     Zola Button, MD 1/14/20218:20 AM

## 2019-09-09 NOTE — Telephone Encounter (Signed)
Scheduled appt per 1/14 los.  Sent a message to HIM pool to get a calendar mailed out. 

## 2019-11-04 ENCOUNTER — Ambulatory Visit: Payer: BC Managed Care – PPO

## 2020-02-21 ENCOUNTER — Other Ambulatory Visit: Payer: Self-pay | Admitting: Interventional Radiology

## 2020-02-21 DIAGNOSIS — D250 Submucous leiomyoma of uterus: Secondary | ICD-10-CM

## 2020-02-24 ENCOUNTER — Other Ambulatory Visit: Payer: Self-pay | Admitting: Obstetrics and Gynecology

## 2020-02-24 ENCOUNTER — Other Ambulatory Visit: Payer: Self-pay | Admitting: Specialist

## 2020-02-24 DIAGNOSIS — D250 Submucous leiomyoma of uterus: Secondary | ICD-10-CM

## 2020-03-02 ENCOUNTER — Other Ambulatory Visit (HOSPITAL_COMMUNITY): Payer: BC Managed Care – PPO

## 2020-03-02 ENCOUNTER — Encounter (HOSPITAL_COMMUNITY): Payer: Self-pay

## 2020-03-07 ENCOUNTER — Other Ambulatory Visit: Payer: BC Managed Care – PPO

## 2020-03-07 ENCOUNTER — Ambulatory Visit: Payer: BC Managed Care – PPO | Admitting: Oncology

## 2020-03-08 ENCOUNTER — Telehealth: Payer: BC Managed Care – PPO

## 2020-03-22 ENCOUNTER — Inpatient Hospital Stay: Payer: BC Managed Care – PPO | Attending: Oncology

## 2020-03-22 ENCOUNTER — Inpatient Hospital Stay: Payer: BC Managed Care – PPO | Admitting: Oncology

## 2020-03-22 ENCOUNTER — Other Ambulatory Visit: Payer: Self-pay

## 2020-03-22 VITALS — BP 152/100 | HR 73 | Temp 97.9°F | Resp 18 | Ht 65.0 in

## 2020-03-22 DIAGNOSIS — D509 Iron deficiency anemia, unspecified: Secondary | ICD-10-CM | POA: Insufficient documentation

## 2020-03-22 DIAGNOSIS — D649 Anemia, unspecified: Secondary | ICD-10-CM | POA: Diagnosis not present

## 2020-03-22 DIAGNOSIS — Z9884 Bariatric surgery status: Secondary | ICD-10-CM | POA: Diagnosis not present

## 2020-03-22 DIAGNOSIS — D573 Sickle-cell trait: Secondary | ICD-10-CM | POA: Insufficient documentation

## 2020-03-22 LAB — CBC WITH DIFFERENTIAL (CANCER CENTER ONLY)
Abs Immature Granulocytes: 0.04 10*3/uL (ref 0.00–0.07)
Basophils Absolute: 0.1 10*3/uL (ref 0.0–0.1)
Basophils Relative: 1 %
Eosinophils Absolute: 0.2 10*3/uL (ref 0.0–0.5)
Eosinophils Relative: 2 %
HCT: 38.5 % (ref 36.0–46.0)
Hemoglobin: 12.7 g/dL (ref 12.0–15.0)
Immature Granulocytes: 0 %
Lymphocytes Relative: 22 %
Lymphs Abs: 2 10*3/uL (ref 0.7–4.0)
MCH: 26.4 pg (ref 26.0–34.0)
MCHC: 33 g/dL (ref 30.0–36.0)
MCV: 80 fL (ref 80.0–100.0)
Monocytes Absolute: 0.5 10*3/uL (ref 0.1–1.0)
Monocytes Relative: 6 %
Neutro Abs: 6.5 10*3/uL (ref 1.7–7.7)
Neutrophils Relative %: 69 %
Platelet Count: 389 10*3/uL (ref 150–400)
RBC: 4.81 MIL/uL (ref 3.87–5.11)
RDW: 13.4 % (ref 11.5–15.5)
WBC Count: 9.3 10*3/uL (ref 4.0–10.5)
nRBC: 0 % (ref 0.0–0.2)

## 2020-03-22 LAB — IRON AND TIBC
Iron: 94 ug/dL (ref 41–142)
Saturation Ratios: 30 % (ref 21–57)
TIBC: 316 ug/dL (ref 236–444)
UIBC: 222 ug/dL (ref 120–384)

## 2020-03-22 LAB — FERRITIN: Ferritin: 22 ng/mL (ref 11–307)

## 2020-03-22 NOTE — Progress Notes (Signed)
Hematology and Oncology Follow Up Visit  Elizabeth Skinner [id number] June 25, 1981 39 y.o. 03/22/2020 10:23 AM Cari Caraway, MDMcNeill, Abigail Butts, MD   Principle Diagnosis: 39 year old woman with anemia related to poor iron due to gastric bypass surgery.  This was noted in 2019 and exacerbated by menstrual blood losses.   Prior Therapy: He is status post Feraheme a total of 1020 mg total dose split with 2 weekly infusions completed on August 29, 2018. She is status post uterine fibroid embolization completed in December 2020.  Current therapy: Oral iron replacement maintenance.  Interim History: Elizabeth Skinner is here for repeat follow-up.  Since the last visit, she reports feeling well without any recent complaints.  Continues to take oral iron supplements as well as prenatal vitamins on a daily basis.  She denies any abdominal pain, dyspepsia or nausea.  She denies any constipation or diarrhea.  He denies any excessive fatigue or tiredness.  She has regular menstrual cycle at this time that has not been heavy as of late.     Medications: Unchanged on review. Current Outpatient Medications  Medication Sig Dispense Refill  . BIOTIN PO Take 1 tablet by mouth daily.     . CYANOCOBALAMIN PO Take 1 tablet by mouth daily.     Marland Kitchen    0  . ferrous sulfate 325 (65 FE) MG tablet TAKE 1 TABLET BY MOUTH EVERY DAY WITH BREAKFAST 90 tablet 3  .      . loratadine-pseudoephedrine (CLARITIN-D 12-HOUR) 5-120 MG tablet Take 1 tablet by mouth 2 (two) times daily as needed for allergies.    .    0  .      . Prenatal Vit-Fe Fumarate-FA (PRENATAL MULTIVITAMIN) TABS tablet Take 1 tablet by mouth daily at 12 noon.    .      . VITAMIN D PO Take 1 tablet by mouth daily.     No current facility-administered medications for this visit.     Allergies: No Known Allergies      Physical Exam:  Blood pressure (!) 152/100, pulse 73, temperature 97.9 F (36.6 C), temperature source Temporal, resp. rate 18, height 5'  5" (1.651 m), SpO2 100 %.     ECOG:  0     General appearance: Comfortable appearing without any discomfort Head: Normocephalic without any trauma Oropharynx: Mucous membranes are moist and pink without any thrush or ulcers. Eyes: Pupils are equal and round reactive to light. Lymph nodes: No cervical, supraclavicular, inguinal or axillary lymphadenopathy.   Heart:regular rate and rhythm.  S1 and S2 without leg edema. Lung: Clear without any rhonchi or wheezes.  No dullness to percussion. Abdomin: Soft, nontender, nondistended with good bowel sounds.  No hepatosplenomegaly. Musculoskeletal: No joint deformity or effusion.  Full range of motion noted. Neurological: No deficits noted on motor, sensory and deep tendon reflex exam. Skin: No petechial rash or dryness.  Appeared moist.        Lab Results: Lab Results  Component Value Date   WBC 8.2 09/09/2019   HGB 12.7 09/09/2019   HCT 38.9 09/09/2019   MCV 80.4 09/09/2019   PLT 406 (H) 09/09/2019     Chemistry      Component Value Date/Time   NA 138 08/11/2019 0845   NA 138 09/24/2018 1426   K 4.8 08/11/2019 0845   CL 104 08/11/2019 0845   CO2 24 08/11/2019 0845   BUN 9 08/11/2019 0845   BUN 11 09/24/2018 1426   CREATININE 0.94 08/11/2019 0845  Component Value Date/Time   CALCIUM 8.9 08/11/2019 0845   ALKPHOS 81 09/24/2018 1426   AST 14 09/24/2018 1426   ALT 15 09/24/2018 1426   BILITOT 0.3 09/24/2018 1426      Impression and Plan:  39 year old woman with:  1.  Anemia diagnosed in 2019 after presenting with iron deficiency due to poor iron absorption from gastric bypass surgery and chronic menstrual losses.   She is currently on oral iron replacement without any major decline.  Iron studies obtained in January 2021 showed normalization of her iron level with ferritin of 28.  Her hemoglobin at that time was 12.7.  The natural course of this disease and alternative treatment options were discussed today.   Repeat intravenous iron treatment will be a consideration if she is intolerant to oral iron therapy.  Complication to intravenous iron were discussed including arthralgias, myalgias and rarely severe reaction and anaphylaxis.  Laboratory data from today reviewed and showed a hemoglobin of 12.7.  After discussion today, we opted to continue with oral iron maintenance therapy.  2.  Menstrual blood losses: She had uterine fibroids that been embolized previously.  She has normal menstrual cycles at this time.    3.  Sickle cell trait: Her MCV close to normal range at this time and has no impact on her hemoglobin.  4.  Follow-up: In 6 months for a follow-up evaluation.  30  minutes were dedicated to this visit.  The time was spent on reviewing her laboratory data, discussing differential diagnosis, management options and future plan of care reviewed.     Zola Button, MD 7/28/202110:23 AM

## 2020-03-27 ENCOUNTER — Telehealth: Payer: Self-pay | Admitting: Oncology

## 2020-03-27 NOTE — Telephone Encounter (Signed)
Scheduled per 07/28 los, patient has been called and notified. 

## 2020-04-27 ENCOUNTER — Other Ambulatory Visit: Payer: Self-pay

## 2020-04-27 ENCOUNTER — Other Ambulatory Visit: Payer: BC Managed Care – PPO

## 2020-04-27 ENCOUNTER — Ambulatory Visit
Admission: RE | Admit: 2020-04-27 | Discharge: 2020-04-27 | Disposition: A | Discharge status: Home / Self Care | Payer: BC Managed Care – PPO | Source: Ambulatory Visit | Attending: Obstetrics and Gynecology | Admitting: Obstetrics and Gynecology

## 2020-04-27 DIAGNOSIS — D250 Submucous leiomyoma of uterus: Secondary | ICD-10-CM

## 2020-04-27 IMAGING — MR MR PELVIS WO/W CM
8 of 10 series · 35 of 48 positions shown · IV contrast (multihance)
Comparison: [DATE]

CLINICAL DATA: Follow-up uterine fibroids, approximately 9 months
status post uterine artery embolization.

EXAM:
MRI PELVIS WITHOUT AND WITH CONTRAST
TECHNIQUE: Multiplanar multisequence MR imaging of the pelvis was performed
both before and after administration of intravenous contrast.
CONTRAST:  20mL MULTIHANCE GADOBENATE DIMEGLUMINE 529 MG/ML IV SOLN

[Series 3: T2 · coronal · 5.0mm · 1.25mm/px · 1 of 30 slices shown (1 of 3)]
[im 1/30]
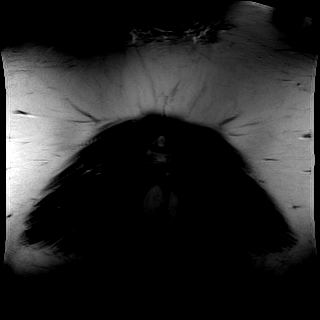

[Series 4: T2 · sagittal · 5.0mm · 0.78mm/px · 1 of 27 slices shown (2 of 3)]
[im 1/27]
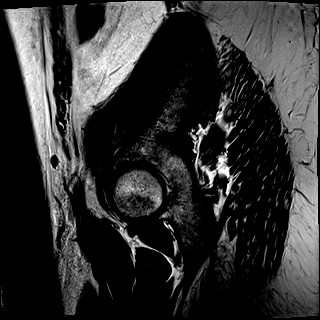

[Series 5: T2 · axial · 5.0mm · 0.41mm/px · z∈[-202,-28]mm · 2 of 30 slices shown (3 of 3)]
[im 1/30]
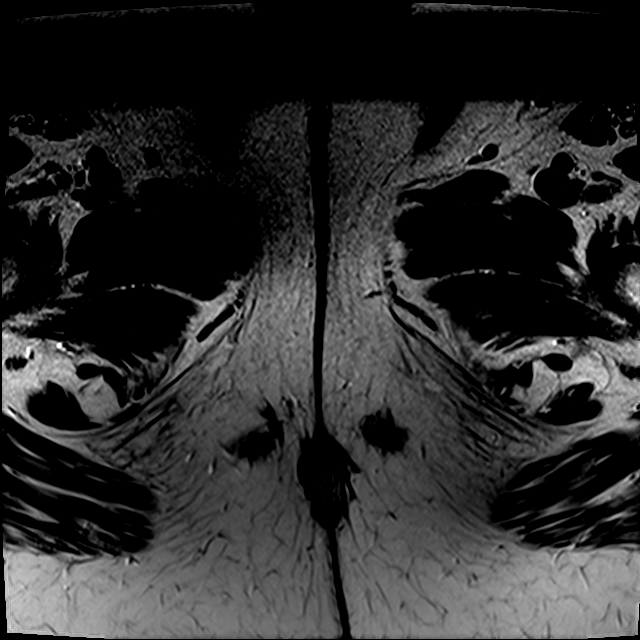
[im 30/30]
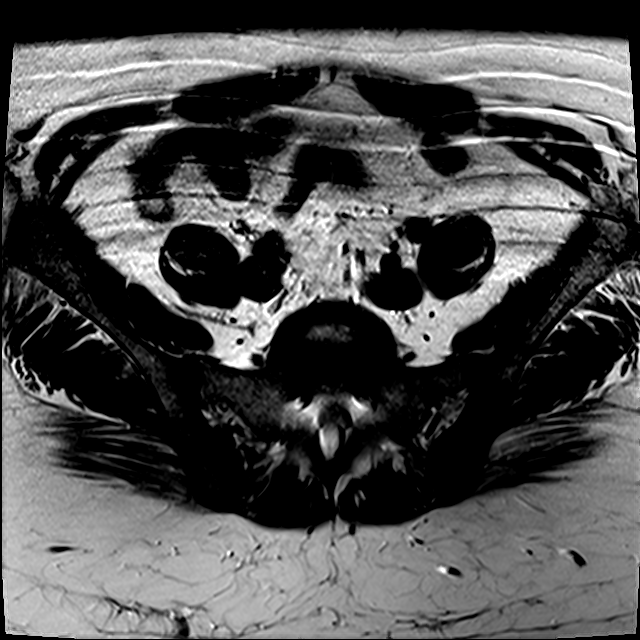

[Series 6: T2 fat-sat · axial · 5.0mm · 0.41mm/px · z∈[-202,-28]mm · 2 of 30 slices shown]
[im 1/30]
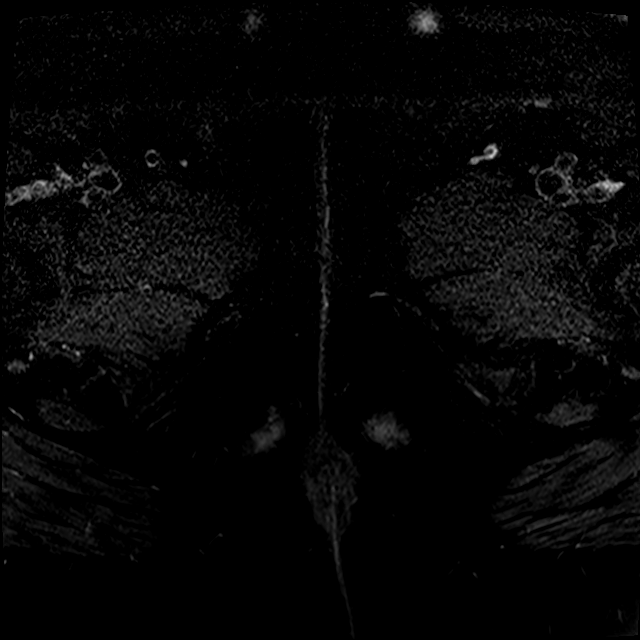
[im 30/30]
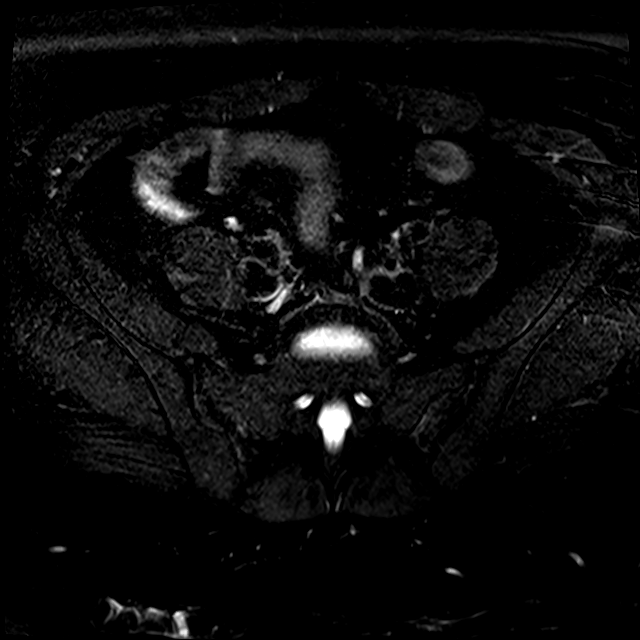

[Series 7: T1 · axial · 1.2mm · 0.75mm/px · z∈[-201,-29]mm · 8 of 144 slices shown]
[im 1/144]
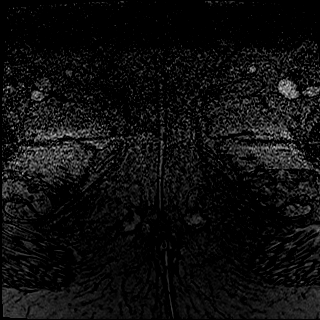
[im 21/144]
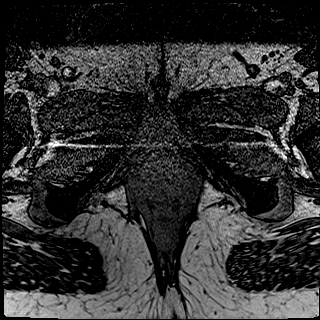
[im 41/144]
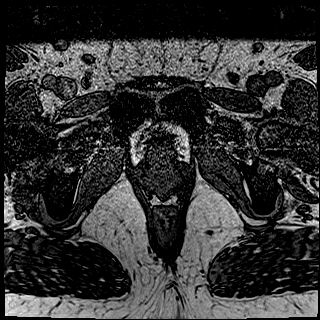
[im 62/144]
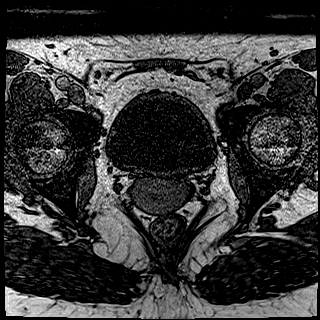
[im 82/144]
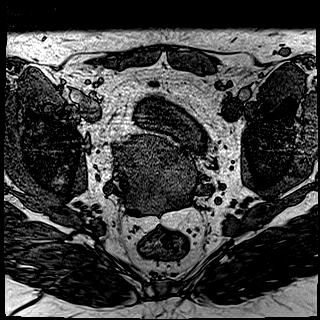
[im 103/144]
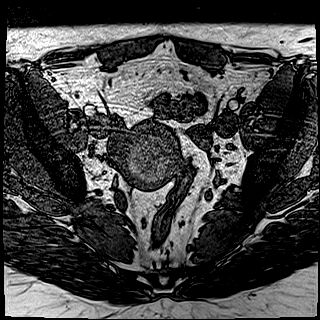
[im 123/144]
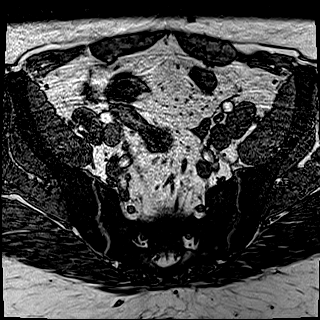
[im 144/144]
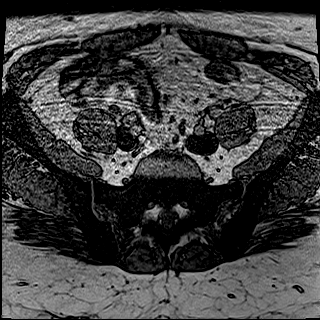

[Series 8: T1 fat-sat · axial · 1.2mm · 0.75mm/px · z∈[-201,-29]mm · 8 of 144 slices shown]
[im 1/144]
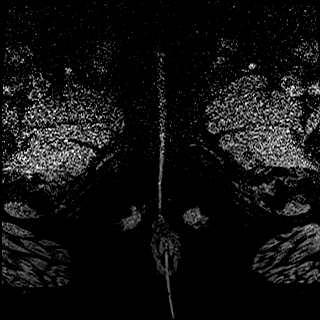
[im 21/144]
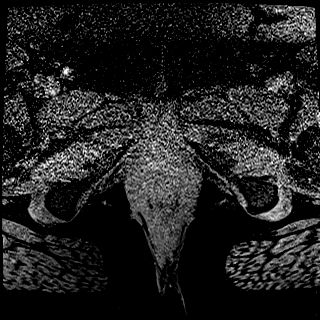
[im 41/144]
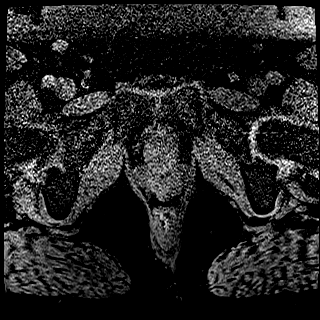
[im 62/144]
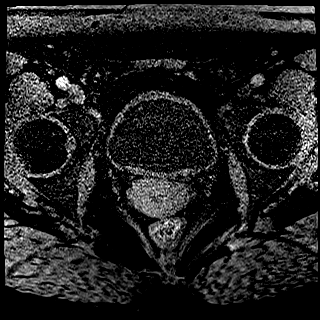
[im 82/144]
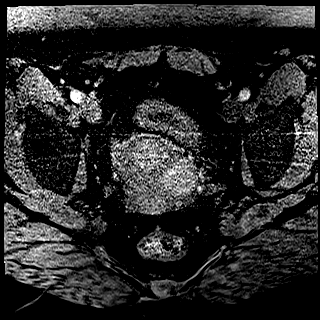
[im 103/144]
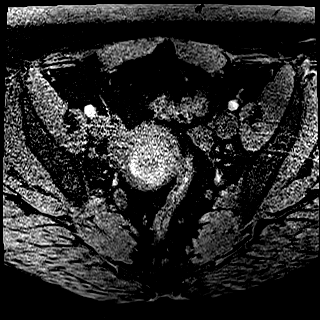
[im 123/144]
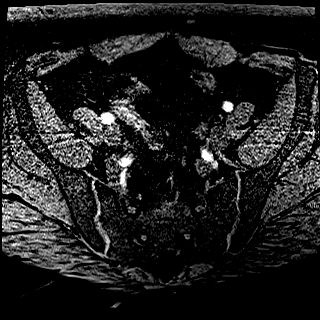
[im 144/144]
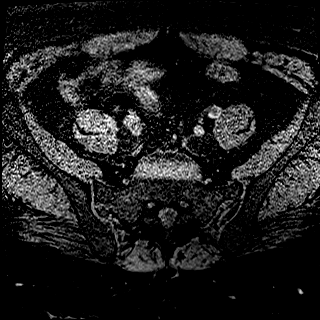

[Series 9: T1 fat-sat post-contrast · axial · 1.2mm · 0.75mm/px · z∈[-201,-29]mm · 8 of 144 slices shown (1 of 2)]
[im 1/144]
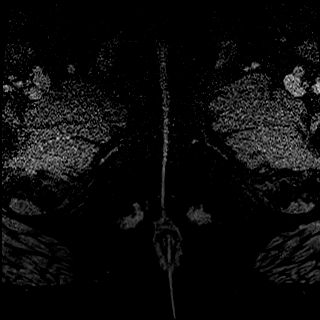
[im 21/144]
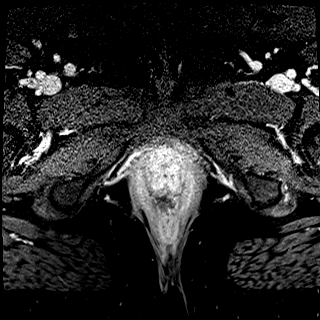
[im 41/144]
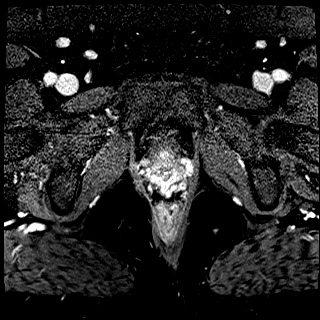
[im 62/144]
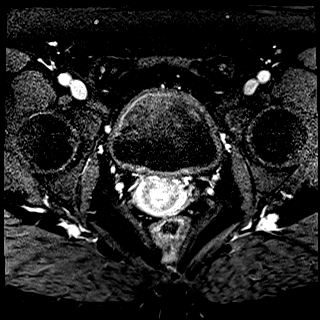
[im 82/144]
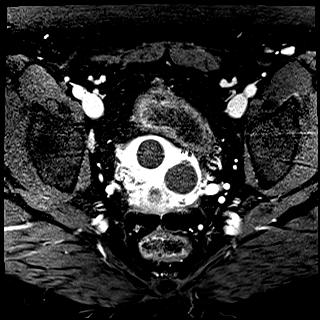
[im 103/144]
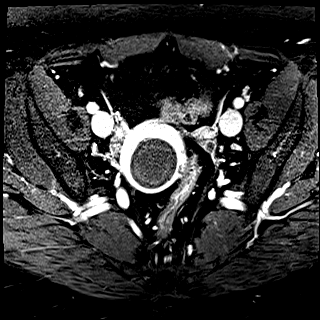
[im 123/144]
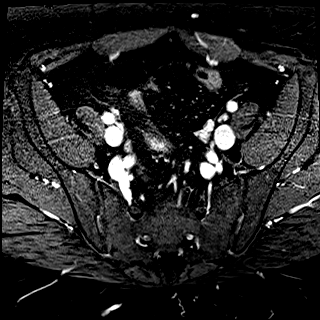
[im 144/144]
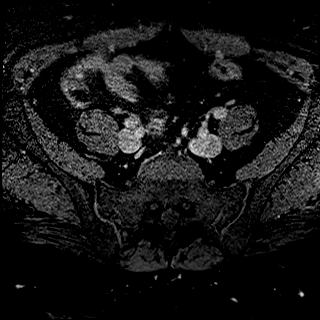

[Series 10: T1 fat-sat post-contrast · axial · 1.2mm · 0.75mm/px · z∈[-201,-104]mm · 5 of 144 slices shown (2 of 2)]
[im 1/144]
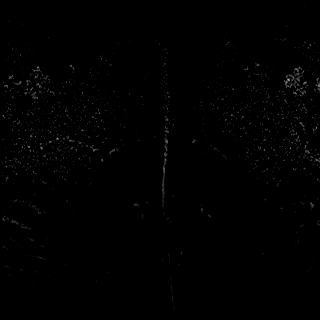
[im 21/144]
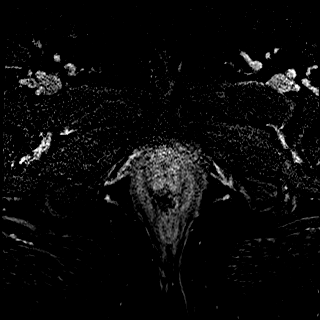
[im 41/144]
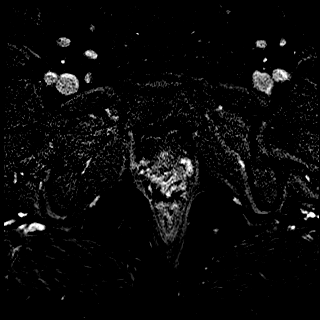
[im 62/144]
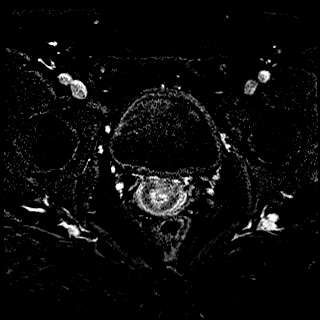
[im 82/144]
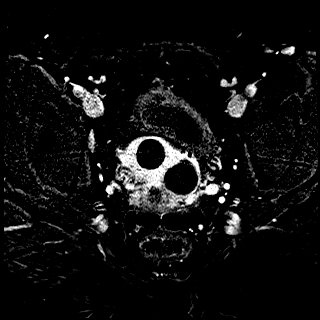

[35 of 48 positions shown; findings below may reference images not displayed]

FINDINGS: Lower Urinary Tract: No urinary bladder or urethral abnormality
identified.

Bowel: Unremarkable pelvic bowel loops.

Vascular/Lymphatic: Unremarkable. No pathologically enlarged pelvic
lymph nodes identified.

Reproductive:

-- Uterus: Measures 8.6 x 4.9 x 6.4 cm (volume = 140 cm^3). This
is decreased in size from 311 cm3 on prior study. Several uterine
fibroids are again seen which is decreased in size since previous
study. Largest fibroid in the right posterior fundus currently
measures 3.8 cm in maximum diameter compared to 5.6 cm previously.
These fibroids no longer show evidence of contrast enhancement.

No new or enlarging uterine masses are seen. Cervix and vagina are
unremarkable in appearance. No abnormal endometrial thickening
noted.

-- Right ovary: Appears normal. No ovarian or adnexal masses
identified.

-- Left ovary: Appears normal. No ovarian or adnexal masses
identified.

Other: No peritoneal thickening or abnormal free fluid.

Musculoskeletal:  Unremarkable.
IMPRESSION: Significant decrease in total uterine volume and size of individual
uterine fibroids, as described above. None of these fibroids show
residual contrast enhancement.

Normal appearance of both ovaries. No adnexal mass identified.

## 2020-04-27 MED ORDER — GADOBENATE DIMEGLUMINE 529 MG/ML IV SOLN
20.0000 mL | Freq: Once | INTRAVENOUS | Status: AC | PRN
  Administered 2020-04-27: 20 mL via INTRAVENOUS

## 2020-05-02 ENCOUNTER — Ambulatory Visit
Admission: RE | Admit: 2020-05-02 | Discharge: 2020-05-02 | Disposition: A | Discharge status: Home / Self Care | Payer: BC Managed Care – PPO | Source: Ambulatory Visit | Attending: Interventional Radiology | Admitting: Interventional Radiology

## 2020-05-02 ENCOUNTER — Encounter: Payer: Self-pay | Admitting: *Deleted

## 2020-05-02 ENCOUNTER — Other Ambulatory Visit: Payer: Self-pay

## 2020-05-02 DIAGNOSIS — D250 Submucous leiomyoma of uterus: Secondary | ICD-10-CM

## 2020-05-02 HISTORY — PX: IR RADIOLOGIST EVAL & MGMT: IMG5224

## 2020-05-02 NOTE — Progress Notes (Signed)
Patient ID: Elizabeth Skinner, female   DOB: 07/27/1981, 39 y.o.   MRN: [id number]       Chief Complaint: Patient was consulted remotely today ([place]) for follow-up uterine fibroid embolization at the request of Jamillah Camilo.    Referring Physician(s): Sheronette Cousins  History of Present Illness: Elizabeth Skinner is a 39 y.o. female Seen for scheduled follow-up 9 months post uterine artery embolization.  To review, she was initially diagnosed with uterine fibroids in the fall 2019 due to marked anemia which responded to Feraheme infusion.  Subsequent worsening heavy menorrhagia and metrorrhagia.  Uncomplicated UFE 82/50/5397.  Her post embolization syndrome was well controlled by the prescribed medication regimen she was able to return to full activity less than 1 week post procedure. She is done well since then.  She feels better.  Her periods are normal again.  She has not required additional treatment for anemia.  She feels like the procedure was a success.  Past Medical History:  Diagnosis Date   Anemia    Fibroids    Iron deficiency    Pre-diabetes     Past Surgical History:  Procedure Laterality Date   CHOLECYSTECTOMY     IR ANGIOGRAM PELVIS SELECTIVE OR SUPRASELECTIVE  08/11/2019   IR ANGIOGRAM PELVIS SELECTIVE OR SUPRASELECTIVE  08/11/2019   IR ANGIOGRAM SELECTIVE EACH ADDITIONAL VESSEL  08/11/2019   IR ANGIOGRAM SELECTIVE EACH ADDITIONAL VESSEL  08/11/2019   IR EMBO TUMOR ORGAN ISCHEMIA INFARCT INC GUIDE ROADMAPPING  08/11/2019   IR RADIOLOGIST EVAL & MGMT  06/01/2019   IR RADIOLOGIST EVAL & MGMT  09/09/2019   IR US GUIDE VASC ACCESS LEFT  08/11/2019    Allergies: Patient has no known allergies.  Medications: Prior to Admission medications   Medication Sig Start Date End Date Taking? Authorizing Provider  BIOTIN PO Take 1 tablet by mouth daily.     [provider]  CYANOCOBALAMIN PO Take 1 tablet by mouth daily.     [provider]   docusate sodium (COLACE) 100 MG capsule Take 1 capsule (100 mg total) by mouth daily as needed for mild constipation or moderate constipation. Patient not taking: Reported on 09/09/2019 08/12/19   Ronney Lion, PA-C  ferrous sulfate 325 (65 FE) MG tablet TAKE 1 TABLET BY MOUTH EVERY DAY WITH BREAKFAST 08/12/19   Wyatt Portela, MD  ibuprofen (ADVIL) 600 MG tablet Take 1 tablet (600 mg total) by mouth every 6 (six) hours. For 5 days, then as needed for abdominal pain. Patient not taking: Reported on 09/09/2019 08/12/19   Ronney Lion, PA-C  loratadine-pseudoephedrine (CLARITIN-D 12-HOUR) 5-120 MG tablet Take 1 tablet by mouth 2 (two) times daily as needed for allergies.    [provider]  ondansetron (ZOFRAN) 8 MG tablet Take 1 tablet (8 mg total) by mouth every 8 (eight) hours as needed for nausea or vomiting. Patient not taking: Reported on 09/09/2019 08/12/19   Ronney Lion, PA-C  oxyCODONE-acetaminophen (PERCOCET/ROXICET) 5-325 MG tablet Take 1 tablet by mouth every 4 (four) hours as needed for moderate pain or severe pain. Patient not taking: Reported on 09/09/2019 08/12/19   Ronney Lion, PA-C  Prenatal Vit-Fe Fumarate-FA (PRENATAL MULTIVITAMIN) TABS tablet Take 1 tablet by mouth daily at 12 noon.    [provider]  Pseudoephedrine-APAP-DM (DAYQUIL PO) Take 1 capsule by mouth 2 (two) times daily as needed (congestion, cold symptoms).    [provider]  VITAMIN D PO Take 1 tablet by mouth daily.  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  Tobacco Use   Smoking status: Never Smoker   Smokeless  tobacco: Never Used  Scientific laboratory technician Use: Never used  Substance and Sexual Activity   Alcohol use: Not on file   Drug use: Not on file   Sexual activity: Not on file  Other Topics Concern   Not on file  Social History Narrative   Not on file   Social Determinants of Health   Financial Resource Strain:    Difficulty of Paying Living Expenses: Not on file  Food Insecurity:    Worried About Charity fundraiser in the Last Year: Not on file   [place] in the Last Year: Not on file  Transportation Needs:    Lack of Transportation (Medical): Not on file   Lack of Transportation (Non-Medical): Not on file  Physical Activity:    Days of Exercise per Week: Not on file   Minutes of Exercise per Session: Not on file  Stress:    Feeling of Stress : Not on file  Social Connections:    Frequency of Communication with Friends and Family: Not on file   Frequency of Social Gatherings with Friends and Family: Not on file   Attends Religious Services: Not on file   Active Member of Clubs or Organizations: Not on file   Attends Archivist Meetings: Not on file   Marital Status: Not on file    ECOG Status: 0 - Asymptomatic  Review of Systems  Review of Systems: A 12 point ROS discussed and pertinent positives are indicated in the HPI above.  All other systems are negative.  Physical Exam No direct physical exam was performed (except for noted visual exam findings with Video Visits).     Vital Signs: There were no vitals taken for this visit.  Imaging: MR PELVIS W WO CONTRAST  Result Date: 04/27/2020 CLINICAL DATA:  Follow-up uterine fibroids, approximately 9 months status post uterine artery embolization. EXAM: MRI PELVIS WITHOUT AND WITH CONTRAST TECHNIQUE: Multiplanar multisequence MR imaging of the pelvis was performed both before and after administration of intravenous contrast. CONTRAST:  20mL MULTIHANCE GADOBENATE DIMEGLUMINE 529 MG/ML IV  SOLN COMPARISON:  06/25/2019 FINDINGS: Lower Urinary Tract: No urinary bladder or urethral abnormality identified. Bowel: Unremarkable pelvic bowel loops. Vascular/Lymphatic: Unremarkable. No pathologically enlarged pelvic lymph nodes identified. Reproductive: -- Uterus: Measures 8.6 x 4.9 x 6.4 cm (volume = 140 cm^3). This is decreased in size from 311 cm3 on prior study. Several uterine fibroids are again seen which is decreased in size since previous study. Largest fibroid in the right posterior fundus currently measures 3.8 cm in maximum diameter compared to 5.6 cm previously. These fibroids no longer show evidence of contrast enhancement. No new or enlarging uterine masses are seen. Cervix and vagina are unremarkable in appearance. No abnormal endometrial thickening noted. -- Right ovary: Appears normal. No ovarian or adnexal masses identified. -- Left ovary: Appears normal. No ovarian or adnexal masses identified. Other: No peritoneal thickening or abnormal free fluid. Musculoskeletal:  Unremarkable. IMPRESSION: Significant decrease in total uterine volume and size of individual uterine fibroids, as described above. None of these fibroids show residual contrast enhancement. Normal appearance of both ovaries. No adnexal mass identified. Electronically Signed   By: Marlaine Hind M.D.   On: 04/27/2020 09:24    Labs:  CBC: Recent Labs    08/11/19 0845 09/09/19 0811 03/22/20 1015  WBC 6.8 8.2 9.3  HGB 13.1 12.7 12.7  HCT 40.7 38.9 38.5  PLT 411* 406* 389    COAGS: Recent Labs    08/11/19 0845  INR 1.0    BMP: Recent Labs    08/11/19 0845  NA 138  K 4.8  CL 104  CO2 24  GLUCOSE 117*  BUN 9  CALCIUM 8.9  CREATININE 0.94  GFRNONAA >60  GFRAA >60    LIVER FUNCTION TESTS: No results for input(s): BILITOT, AST, ALT, ALKPHOS, PROT, ALBUMIN in the last 8760 hours.  TUMOR MARKERS: No results for input(s): AFPTM, CEA, CA199, CHROMGRNA in the last 8760 hours.  Assessment and  Plan:  My impression is that the patient has done very well post uterine fibroid embolization.  She feels like her presenting symptoms have resolved.  Additionally, she no longer has need for treatment of anemia.  Her MRI looks great, all the fibroids are nonenhancing and have decreased in size.  I think she is on track for good long-term durable result.  We discussed the anticipated long-term results, low risk of developing new symptomatic fibroids before menopause.  We discussed the need for continued routine gynecologic care.  She seemed to understand and   answered asked appropriate questions, which were answered. We will not need to see her back for scheduled follow-up given her excellent results and post procedure MRI. She knows to telephone at any point should she have any questions or issues possibly related to the uterine fibroid embolization.  Thank you for this interesting consult.  I greatly enjoyed meeting Elizabeth Skinner and look forward to participating in their care.  A copy of this report was sent to the requesting provider on this date.  Electronically Signed: Rickard Rhymes 05/02/2020, 11:07 AM   I spent a total of    25 Minutes in remote  clinical consultation, greater than 50% of which was counseling/coordinating care for Uterine fibroids, post embolization.    Visit type: Audio only (telephone). Audio (no video) only due to patient's lack of internet/smartphone capability. Alternative for in-person consultation at [facility], [facility] [place], [place], [place]. This visit type was conducted due to national recommendations for restrictions regarding the COVID-19 Pandemic (e.g. social distancing).  This format is felt to be most appropriate for this patient at this time.  All issues noted in this document were discussed and addressed.

## 2020-07-26 ENCOUNTER — Other Ambulatory Visit: Payer: Self-pay | Admitting: Oncology

## 2020-09-18 ENCOUNTER — Telehealth: Payer: Self-pay | Admitting: Oncology

## 2020-09-18 NOTE — Telephone Encounter (Signed)
Called pt per 1/24 sch msg - unable to reach pt. Left message for patient to call back to reschedule appt.

## 2020-09-19 ENCOUNTER — Other Ambulatory Visit: Payer: Self-pay | Admitting: *Deleted

## 2020-09-19 DIAGNOSIS — D649 Anemia, unspecified: Secondary | ICD-10-CM

## 2020-09-20 ENCOUNTER — Inpatient Hospital Stay: Payer: Self-pay | Admitting: Oncology

## 2020-09-20 ENCOUNTER — Inpatient Hospital Stay: Payer: Self-pay

## 2020-10-10 ENCOUNTER — Telehealth: Payer: Self-pay

## 2020-10-10 NOTE — Telephone Encounter (Signed)
Received call from pt. regarding rescheduling her canceled Lab + MD appt. with Dr. Alen Blew on 01/26.   She is requesting for her Lab + MD visit to be rescheduled on any of the following days preferably in the morning if possible: 02/22, 02/24, 02/25, 03/02 or 03/03.  Scheduling message sent with this request. Scheduling to call pt. with date and time of appt.

## 2020-10-12 ENCOUNTER — Telehealth: Payer: Self-pay | Admitting: Oncology

## 2020-10-12 NOTE — Telephone Encounter (Signed)
Called pt per 2/15 sch msg - left message for patient to call back to reschedule appt. No availability on dates given in the message.

## 2020-10-16 ENCOUNTER — Telehealth: Payer: Self-pay | Admitting: Oncology

## 2020-10-16 NOTE — Telephone Encounter (Signed)
R/s cancelled appointments from 1/27 per patient request. Patient aware of appointments date and times.

## 2020-10-20 ENCOUNTER — Other Ambulatory Visit: Payer: Self-pay

## 2020-10-20 ENCOUNTER — Inpatient Hospital Stay: Payer: BC Managed Care – PPO | Admitting: Oncology

## 2020-10-20 ENCOUNTER — Inpatient Hospital Stay: Payer: BC Managed Care – PPO | Attending: Oncology

## 2020-10-20 VITALS — BP 150/98 | HR 75 | Temp 97.8°F | Resp 17 | Ht 65.0 in

## 2020-10-20 DIAGNOSIS — D649 Anemia, unspecified: Secondary | ICD-10-CM | POA: Diagnosis not present

## 2020-10-20 DIAGNOSIS — D500 Iron deficiency anemia secondary to blood loss (chronic): Secondary | ICD-10-CM | POA: Insufficient documentation

## 2020-10-20 DIAGNOSIS — Z9884 Bariatric surgery status: Secondary | ICD-10-CM | POA: Insufficient documentation

## 2020-10-20 DIAGNOSIS — K909 Intestinal malabsorption, unspecified: Secondary | ICD-10-CM | POA: Diagnosis not present

## 2020-10-20 DIAGNOSIS — N920 Excessive and frequent menstruation with regular cycle: Secondary | ICD-10-CM | POA: Insufficient documentation

## 2020-10-20 DIAGNOSIS — I10 Essential (primary) hypertension: Secondary | ICD-10-CM | POA: Diagnosis not present

## 2020-10-20 LAB — CBC WITH DIFFERENTIAL (CANCER CENTER ONLY)
Abs Immature Granulocytes: 0.02 10*3/uL (ref 0.00–0.07)
Basophils Absolute: 0.1 10*3/uL (ref 0.0–0.1)
Basophils Relative: 1 %
Eosinophils Absolute: 0.2 10*3/uL (ref 0.0–0.5)
Eosinophils Relative: 3 %
HCT: 38.9 % (ref 36.0–46.0)
Hemoglobin: 12.5 g/dL (ref 12.0–15.0)
Immature Granulocytes: 0 %
Lymphocytes Relative: 30 %
Lymphs Abs: 2.2 10*3/uL (ref 0.7–4.0)
MCH: 25.9 pg — ABNORMAL LOW (ref 26.0–34.0)
MCHC: 32.1 g/dL (ref 30.0–36.0)
MCV: 80.5 fL (ref 80.0–100.0)
Monocytes Absolute: 0.5 10*3/uL (ref 0.1–1.0)
Monocytes Relative: 6 %
Neutro Abs: 4.6 10*3/uL (ref 1.7–7.7)
Neutrophils Relative %: 60 %
Platelet Count: 416 10*3/uL — ABNORMAL HIGH (ref 150–400)
RBC: 4.83 MIL/uL (ref 3.87–5.11)
RDW: 13.5 % (ref 11.5–15.5)
WBC Count: 7.6 10*3/uL (ref 4.0–10.5)
nRBC: 0 % (ref 0.0–0.2)

## 2020-10-20 NOTE — Progress Notes (Signed)
Hematology and Oncology Follow Up Visit  Elizabeth Skinner [id number] 1980/11/24 40 y.o. 10/20/2020 2:42 PM Elizabeth Skinner, MDMcNeill, Abigail Butts, MD   Principle Diagnosis: 40 year old woman with iron deficiency anemia related to poor iron absorption and menstrual blood losses.   Prior Therapy: He is status post Feraheme a total of 1020 mg total dose split with 2 weekly infusions completed on August 29, 2018. She is status post uterine fibroid embolization completed in December 2020.  Current therapy: Oral iron 325 mg daily.  Interim History: [name] presents today for repeat evaluation.  Since her last visit, she reports feeling well without any complaints.  She continues to take oral iron therapy without any new complications.  She denies any dyspepsia, abdominal pain or constipation.  Her performance status quality of life remained excellent.     Medications: Updated on review. Current Outpatient Medications  Medication Sig Dispense Refill  . BIOTIN PO Take 1 tablet by mouth daily.     . CYANOCOBALAMIN PO Take 1 tablet by mouth daily.     Marland Kitchen    0  . ferrous sulfate 325 (65 FE) MG tablet TAKE 1 TABLET BY MOUTH EVERY DAY WITH BREAKFAST 90 tablet 3  .      . loratadine-pseudoephedrine (CLARITIN-D 12-HOUR) 5-120 MG tablet Take 1 tablet by mouth 2 (two) times daily as needed for allergies.    .    0  .      . Prenatal Vit-Fe Fumarate-FA (PRENATAL MULTIVITAMIN) TABS tablet Take 1 tablet by mouth daily at 12 noon.    .      . VITAMIN D PO Take 1 tablet by mouth daily.     No current facility-administered medications for this visit.     Allergies: No Known Allergies      Physical Exam:  Blood pressure (!) 150/98, pulse 75, temperature 97.8 F (36.6 C), temperature source Tympanic, resp. rate 17, height 5\' 5"  (1.651 m), SpO2 100 %.     ECOG:  0   General appearance: Alert, awake without any distress. Head: Atraumatic without abnormalities Oropharynx: Without any thrush or  ulcers. Eyes: No scleral icterus. Lymph nodes: No lymphadenopathy noted in the cervical, supraclavicular, or axillary nodes Heart:regular rate and rhythm, without any murmurs or gallops.   Lung: Clear to auscultation without any rhonchi, wheezes or dullness to percussion. Abdomin: Soft, nontender without any shifting dullness or ascites. Musculoskeletal: No clubbing or cyanosis. Neurological: No motor or sensory deficits. Skin: No rashes or lesions.       Lab Results: Lab Results  Component Value Date   WBC 7.6 10/20/2020   HGB 12.5 10/20/2020   HCT 38.9 10/20/2020   MCV 80.5 10/20/2020   PLT 416 (H) 10/20/2020     Chemistry      Component Value Date/Time   NA 138 08/11/2019 0845   NA 138 09/24/2018 1426   K 4.8 08/11/2019 0845   CL 104 08/11/2019 0845   CO2 24 08/11/2019 0845   BUN 9 08/11/2019 0845   BUN 11 09/24/2018 1426   CREATININE 0.94 08/11/2019 0845      Component Value Date/Time   CALCIUM 8.9 08/11/2019 0845   ALKPHOS 81 09/24/2018 1426   AST 14 09/24/2018 1426   ALT 15 09/24/2018 1426   BILITOT 0.3 09/24/2018 1426     Results for Elizabeth Skinner (MRN [id number]) as of 10/20/2020 14:36  Ref. Range 02/16/2019 08:42 09/09/2019 08:11 03/22/2020 10:15  Iron Latest Ref Range: 41 - 142 ug/dL 53 103  94  UIBC Latest Ref Range: 120 - 384 ug/dL 243 194 222  TIBC Latest Ref Range: 236 - 444 ug/dL 296 298 316  Saturation Ratios Latest Ref Range: 21 - 57 % 18 (L) 35 30  Ferritin Latest Ref Range: 11 - 307 ng/mL 24 28 22    Impression and Plan:  40 year old woman with:  1.  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   The natural course of this disease discussed today and treatment options were reviewed.  She continues to be on oral iron replacement with stabilization of her hemoglobin.  Iron studies in the last year have remained overall consistent.  Risks and benefits of repeat  intravenous iron were discussed.  And this will be deferred unless she developed severe anemia.  2.  Menstrual blood losses: No issues reported at this time after embolization.   3.    Hypertension: Her diastolic is elevated but has been within normal range between visits.  I recommended continuing ambulatory monitoring and recording it.  4.  Follow-up: She will return in 6 months for a follow-up evaluation.  30  minutes were spent on this encounter.  Time was dedicated to reviewing laboratory data, disease status update and treatment options for the future.     Elizabeth Button, MD 2/25/20222:42 PM

## 2020-10-23 ENCOUNTER — Telehealth: Payer: Self-pay | Admitting: *Deleted

## 2020-10-23 LAB — FERRITIN: Ferritin: 33 ng/mL (ref 11–307)

## 2020-10-23 LAB — IRON AND TIBC
Iron: 138 ug/dL (ref 41–142)
Saturation Ratios: 43 % (ref 21–57)
TIBC: 320 ug/dL (ref 236–444)
UIBC: 183 ug/dL (ref 120–384)

## 2020-10-23 NOTE — Telephone Encounter (Signed)
-----   Message from Wyatt Portela, MD sent at 10/23/2020 10:25 AM EST ----- Please let her know her iron is good and to keep iron intake daily. No need for IV iron.

## 2020-10-23 NOTE — Telephone Encounter (Signed)
Notified of message below

## 2021-04-20 ENCOUNTER — Ambulatory Visit: Payer: BC Managed Care – PPO | Admitting: Oncology

## 2021-04-20 ENCOUNTER — Other Ambulatory Visit: Payer: BC Managed Care – PPO

## 2021-04-23 ENCOUNTER — Telehealth: Payer: Self-pay | Admitting: Oncology

## 2021-04-23 NOTE — Telephone Encounter (Signed)
Rescheduled 09/30 appointments per providers request, called patient and left a voicemail.

## 2021-05-22 ENCOUNTER — Inpatient Hospital Stay (HOSPITAL_BASED_OUTPATIENT_CLINIC_OR_DEPARTMENT_OTHER): Payer: BC Managed Care – PPO | Admitting: Oncology

## 2021-05-22 ENCOUNTER — Telehealth: Payer: Self-pay | Admitting: *Deleted

## 2021-05-22 ENCOUNTER — Inpatient Hospital Stay: Payer: BC Managed Care – PPO | Attending: Oncology

## 2021-05-22 ENCOUNTER — Other Ambulatory Visit: Payer: Self-pay

## 2021-05-22 VITALS — BP 139/92 | HR 72 | Temp 98.1°F | Resp 17 | Ht 65.0 in | Wt 377.0 lb

## 2021-05-22 DIAGNOSIS — D649 Anemia, unspecified: Secondary | ICD-10-CM

## 2021-05-22 DIAGNOSIS — D500 Iron deficiency anemia secondary to blood loss (chronic): Secondary | ICD-10-CM | POA: Diagnosis not present

## 2021-05-22 DIAGNOSIS — Z9884 Bariatric surgery status: Secondary | ICD-10-CM | POA: Insufficient documentation

## 2021-05-22 LAB — CBC WITH DIFFERENTIAL (CANCER CENTER ONLY)
Abs Immature Granulocytes: 0.04 10*3/uL (ref 0.00–0.07)
Basophils Absolute: 0.1 10*3/uL (ref 0.0–0.1)
Basophils Relative: 1 %
Eosinophils Absolute: 0.2 10*3/uL (ref 0.0–0.5)
Eosinophils Relative: 3 %
HCT: 38.8 % (ref 36.0–46.0)
Hemoglobin: 12.8 g/dL (ref 12.0–15.0)
Immature Granulocytes: 1 %
Lymphocytes Relative: 25 %
Lymphs Abs: 1.8 10*3/uL (ref 0.7–4.0)
MCH: 26.6 pg (ref 26.0–34.0)
MCHC: 33 g/dL (ref 30.0–36.0)
MCV: 80.7 fL (ref 80.0–100.0)
Monocytes Absolute: 0.5 10*3/uL (ref 0.1–1.0)
Monocytes Relative: 7 %
Neutro Abs: 4.5 10*3/uL (ref 1.7–7.7)
Neutrophils Relative %: 63 %
Platelet Count: 390 10*3/uL (ref 150–400)
RBC: 4.81 MIL/uL (ref 3.87–5.11)
RDW: 13 % (ref 11.5–15.5)
WBC Count: 7 10*3/uL (ref 4.0–10.5)
nRBC: 0 % (ref 0.0–0.2)

## 2021-05-22 LAB — IRON AND TIBC
Iron: 68 ug/dL (ref 41–142)
Saturation Ratios: 23 % (ref 21–57)
TIBC: 291 ug/dL (ref 236–444)
UIBC: 223 ug/dL (ref 120–384)

## 2021-05-22 LAB — FERRITIN: Ferritin: 58 ng/mL (ref 11–307)

## 2021-05-22 NOTE — Telephone Encounter (Signed)
-----   Message from Wyatt Portela, MD sent at 05/22/2021 11:29 AM EDT ----- Please let her know her iron is normal.  She can take the oral iron 3 times a week instead of daily.  Thanks

## 2021-05-22 NOTE — Telephone Encounter (Signed)
Per Dr.Shadad, called pt with message below. Pt verbalized understanding

## 2021-05-22 NOTE — Progress Notes (Signed)
Hematology and Oncology Follow Up Visit  Elizabeth Skinner [id number] September 14, 1980 40 y.o. 05/22/2021 9:39 AM Cari Caraway, MDMcNeill, Abigail Butts, MD   Principle Diagnosis: 40 year old woman with iron deficiency anemia diagnosed in 2019.  Her anemia is related to uterine fibroids and chronic menstrual blood losses as well as her gastric bypass surgery.   Prior Therapy: He is status post Feraheme a total of 1020 mg total dose split with 2 weekly infusions completed on August 29, 2018. She is status post uterine fibroid embolization completed in December 2020.  Current therapy: Oral iron 325 mg daily.  Interim History: Elizabeth Skinner is here for return follow-up.  Since last visit, he reports no major changes in her health.  He is currently on oral iron therapy without any major complications with.  He denies any nausea or fatigue.  He denies any hematochezia or melena.  Her menstrual cycles are regular and not heavy at this time.     Medications: Reviewed without changes. Current Outpatient Medications  Medication Sig Dispense Refill   BIOTIN PO Take 1 tablet by mouth daily.      CYANOCOBALAMIN PO Take 1 tablet by mouth daily.         0   ferrous sulfate 325 (65 FE) MG tablet TAKE 1 TABLET BY MOUTH EVERY DAY WITH BREAKFAST 90 tablet 3         loratadine-pseudoephedrine (CLARITIN-D 12-HOUR) 5-120 MG tablet Take 1 tablet by mouth 2 (two) times daily as needed for allergies.        0         Prenatal Vit-Fe Fumarate-FA (PRENATAL MULTIVITAMIN) TABS tablet Take 1 tablet by mouth daily at 12 noon.           VITAMIN D PO Take 1 tablet by mouth daily.     No current facility-administered medications for this visit.     Allergies: No Known Allergies      Physical Exam:    Blood pressure (!) 139/92, pulse 72, temperature 98.1 F (36.7 C), temperature source Oral, resp. rate 17, height 5\' 5"  (1.651 m), weight (!) 377 lb (171 kg), SpO2 100 %.    ECOG:  0    General appearance:  Comfortable appearing without any discomfort Head: Normocephalic without any trauma Oropharynx: Mucous membranes are moist and pink without any thrush or ulcers. Eyes: Pupils are equal and round reactive to light. Lymph nodes: No cervical, supraclavicular, inguinal or axillary lymphadenopathy.   Heart:regular rate and rhythm.  S1 and S2 without leg edema. Lung: Clear without any rhonchi or wheezes.  No dullness to percussion. Abdomin: Soft, nontender, nondistended with good bowel sounds.  No hepatosplenomegaly. Musculoskeletal: No joint deformity or effusion.  Full range of motion noted. Neurological: No deficits noted on motor, sensory and deep tendon reflex exam. Skin: No petechial rash or dryness.  Appeared moist.         Lab Results: Lab Results  Component Value Date   WBC 7.6 10/20/2020   HGB 12.5 10/20/2020   HCT 38.9 10/20/2020   MCV 80.5 10/20/2020   PLT 416 (H) 10/20/2020     Chemistry      Component Value Date/Time   NA 138 08/11/2019 0845   NA 138 09/24/2018 1426   K 4.8 08/11/2019 0845   CL 104 08/11/2019 0845   CO2 24 08/11/2019 0845   BUN 9 08/11/2019 0845   BUN 11 09/24/2018 1426   CREATININE 0.94 08/11/2019 0845      Component Value Date/Time  CALCIUM 8.9 08/11/2019 0845   ALKPHOS 81 09/24/2018 1426   AST 14 09/24/2018 1426   ALT 15 09/24/2018 1426   BILITOT 0.3 09/24/2018 1426     Results for TARRI, GUILFOIL (MRN [id number]) as of 05/22/2021 09:42  Ref. Range 10/20/2020 14:18  Iron Latest Ref Range: 41 - 142 ug/dL 138  UIBC Latest Ref Range: 120 - 384 ug/dL 183  TIBC Latest Ref Range: 236 - 444 ug/dL 320  Saturation Ratios Latest Ref Range: 21 - 57 % 43  Ferritin Latest Ref Range: 11 - 307 ng/mL 33    Impression and Plan:  40 year old woman with:   1.  Iron deficiency anemia related to chronic menstrual blood losses and poor iron absorption diagnosed in 2019.   Her disease status was updated at this time and laboratory testing in the last  year were reviewed.  Her iron studies in February 2022 were all within normal range.  At this time I see no need for any additional IV iron replacement.  Oral iron replacement will be used as needed.  We will await the results of her iron studies and communicate our final recommendations for her.  Decreasing it to 3 times a week for discontinuation.  Goals will be a possibility.  2.  Menstrual blood losses: Status post embolization without any increase in her menstrual cycles.   3.    Hypertension: Mildly elevated but overall under control.   4.  Follow-up: Will be as needed in the future.   30  minutes were dedicated to this encounter.  The time spent on reviewing laboratory data, reviewing her disease status, treatment choices and future plan of care discussion.       Elizabeth Button, MD 9/27/20229:39 AM

## 2021-05-25 ENCOUNTER — Ambulatory Visit: Payer: BC Managed Care – PPO | Admitting: Oncology

## 2021-05-25 ENCOUNTER — Other Ambulatory Visit: Payer: BC Managed Care – PPO

## 2021-06-27 ENCOUNTER — Other Ambulatory Visit: Payer: Self-pay | Admitting: Oncology

## 2022-06-22 ENCOUNTER — Other Ambulatory Visit: Payer: Self-pay | Admitting: Oncology

## 2022-06-26 ENCOUNTER — Encounter: Payer: Self-pay | Admitting: Oncology

## 2023-08-25 ENCOUNTER — Encounter: Payer: Self-pay | Admitting: Oncology

## 2024-06-03 ENCOUNTER — Ambulatory Visit: Payer: Self-pay | Admitting: Dermatology

## 2024-06-08 ENCOUNTER — Other Ambulatory Visit: Payer: Self-pay | Admitting: Medical Genetics

## 2024-06-09 ENCOUNTER — Encounter: Payer: Self-pay | Admitting: Oncology

## 2024-06-15 ENCOUNTER — Other Ambulatory Visit (HOSPITAL_COMMUNITY)
Admission: RE | Admit: 2024-06-15 | Discharge: 2024-06-15 | Disposition: A | Discharge status: Home / Self Care | Payer: Self-pay | Source: Ambulatory Visit | Attending: Oncology | Admitting: Oncology

## 2024-06-30 LAB — GENECONNECT MOLECULAR SCREEN: Genetic Analysis Overall Interpretation: NEGATIVE

## 2024-07-28 ENCOUNTER — Ambulatory Visit: Payer: Self-pay | Admitting: Dermatology

## 2024-07-28 ENCOUNTER — Encounter: Payer: Self-pay | Admitting: Dermatology

## 2024-07-28 VITALS — BP 129/87 | HR 77

## 2024-07-28 DIAGNOSIS — L700 Acne vulgaris: Secondary | ICD-10-CM

## 2024-07-28 DIAGNOSIS — L810 Postinflammatory hyperpigmentation: Secondary | ICD-10-CM | POA: Diagnosis not present

## 2024-07-28 MED ORDER — SAFETY SEAL MISCELLANEOUS MISC: 1.0000 | Freq: Every evening | 9 refills | Status: AC

## 2024-07-28 MED ORDER — TRETINOIN 0.025 % EX CREA: TOPICAL_CREAM | Freq: Every day | CUTANEOUS | 4 refills | Status: AC

## 2024-07-28 NOTE — Progress Notes (Signed)
   New Patient Visit   Subjective  Elizabeth Skinner is a 43 y.o. female who presents for the following: Hyperpigmentation secondary to Acne  Patient states she has hyperpigmentation located at the face that she would like to have examined. Patient reports the areas have been there for several years.She states she has history of acne since she was a teen but never had any treatment. She reports the areas are not bothersome. Patient reports she has previously been treated for these areas. Patient states the dark marks will happen if she gets a pimple then it will get darker. She is currently washing with Keels, she moisturizer with Cetaphil or CeraVe AM with SPF. She also has SPF in her makeup.  Patient reports she is not actively pregnant trying to conceive or nursing.  Patient provided verbal consent for the use of an AI-assisted program to generate a detailed after-visit summary. The patient understands that the AI tool is used to support clinical documentation and that all information will be reviewed and verified by the healthcare provider.  The following portions of the chart were reviewed this encounter and updated as appropriate: medications, allergies, medical history  Review of Systems:  No other skin or systemic complaints except as noted in HPI or Assessment and Plan.  Objective  Well appearing patient in no apparent distress; mood and affect are within normal limits.  A focused examination was performed of the following areas: Face  Relevant exam findings are noted in the Assessment and Plan.            Assessment & Plan  Hyperpigmentation from secondary to previous Acne FLares Exam: Brown macules in the areas of prior acne lesions  Patient Education I counseled the patient regarding the following: Skin care: Recommend minimizing sun exposure, wearing sunscreen and protective clothing. / Expectations: Post Inflammatory pigmentary change is lighter or darker discoloration  than surrounding skin resulting from trauma or rashes. Areas tend to normalize over time, but can take months to years.  Treatment Plan: - Prescribed topical  started today will help lift some excess pigment -once skin is acclimated to retinoid we will add in a separate topical lightening agent -recommended broad spectrum SPF30 every day, hats and sun avoidence.   - Wash with SA Wash in the AM (Provided with Samples) - Continue Keels wash at night - Plan to follow up in 6 Months ACNE VULGARIS   Related Medications tretinoin (RETIN-A) 0.025 % cream Apply topically at bedtime. Apply topically at bedtime. Apply 1 night weekly for the first month, Apply 2 nights weekly. If your skin tolerates increase to 3 nights weekly starting at 3 months. Decrease usage if you experience excessive dryness to 1-2 nights POST-INFLAMMATORY HYPERPIGMENTATION   Related Medications Safety Seal Miscellaneous MISC Apply 1 Application topically at bedtime. Medication Name: Melaxemic Cream (Tranexamic Acid 5%, Kojic Acid 2%, Vit C 2.5%, Hyaluronic Acid 0.1%)  Return in about 6 months (around 01/26/2025) for PIH & Acne F/U.  I, Jetta Ager, am acting as neurosurgeon for [employer], DO.  Documentation: I have reviewed the above documentation for accuracy and completeness, and I agree with the above.  Delon Lenis, DO

## 2024-07-28 NOTE — Patient Instructions (Addendum)
 VISIT SUMMARY:  Today, we discussed your concerns about acne and hyperpigmentation. You experience infrequent acne flares that result in persistent dark spots, which can last for several months. We reviewed your current skincare routine and made some adjustments to help manage and improve your skin condition.  YOUR PLAN:  -ACNE VULGARIS WITH POSTINFLAMMATORY HYPERPIGMENTATION:  Acne vulgaris is a common skin condition that occurs when hair follicles become clogged with oil and dead skin cells, leading to pimples. Postinflammatory hyperpigmentation refers to the dark spots that remain after the acne heals.   To help fade these dark spots and prevent new acne, you have been prescribed a nighttime topical regimen including tretinoin 0.025% cream, which promotes cell turnover and prevents acne formation.   You may experience some dryness as a side effect. Additionally, you will use a compounded topical with tranexamic acid, vitamin C, and botanicals to target pigmentation.   In the morning, use an exfoliating wash with salicylic acid to prevent clogged pores and lighten spots.   Continue using sunscreen daily to prevent dark spots from worsening.   Samples of La Roche-Posay and CeraVe washes have been provided for you to try. Use the Kiehl's wash at night, increasing frequency as tolerated.  INSTRUCTIONS:  Please follow up in six months to assess your progress and adjust your treatment as needed.    Important Information   Due to recent changes in healthcare laws, you may see results of your pathology and/or laboratory studies on MyChart before the doctors have had a chance to review them. We understand that in some cases there may be results that are confusing or concerning to you. Please understand that not all results are received at the same time and often the doctors may need to interpret multiple results in order to provide you with the best plan of care or course of treatment.  Therefore, we ask that you please give us  2 business days to thoroughly review all your results before contacting the office for clarification. Should we see a critical lab result, you will be contacted sooner.     If You Need Anything After Your Visit   If you have any questions or concerns for your doctor, please call our main line at [phone]. If no one answers, please leave a voicemail as directed and we will return your call as soon as possible. Messages left after 4 pm will be answered the following business day.    You may also send us  a message via MyChart. We typically respond to MyChart messages within 1-2 business days.  For prescription refills, please ask your pharmacy to contact our office. Our fax number is [phone].  If you have an urgent issue when the clinic is closed that cannot wait until the next business day, you can page your doctor at the number below.     Please note that while we do our best to be available for urgent issues outside of office hours, we are not available 24/7.    If you have an urgent issue and are unable to reach us , you may choose to seek medical care at your doctor's office, retail clinic, urgent care center, or emergency room.   If you have a medical emergency, please immediately call 911 or go to the emergency department. In the event of inclement weather, please call our main line at [phone] for an update on the status of any delays or closures.  Dermatology Medication Tips: Please keep the boxes that topical medications come  in in order to help keep track of the instructions about where and how to use these. Pharmacies typically print the medication instructions only on the boxes and not directly on the medication tubes.   If your medication is too expensive, please contact our office at [phone] or send us  a message through MyChart.    We are unable to tell what your co-pay for medications will be in advance as this is  different depending on your insurance coverage. However, we may be able to find a substitute medication at lower cost or fill out paperwork to get insurance to cover a needed medication.    If a prior authorization is required to get your medication covered by your insurance company, please allow us  1-2 business days to complete this process.   Drug prices often vary depending on where the prescription is filled and some pharmacies may offer cheaper prices.   The website www.goodrx.com contains coupons for medications through different pharmacies. The prices here do not account for what the cost may be with help from insurance (it may be cheaper with your insurance), but the website can give you the price if you did not use any insurance.  - You can print the associated coupon and take it with your prescription to the pharmacy.  - You may also stop by our office during regular business hours and pick up a GoodRx coupon card.  - If you need your prescription sent electronically to a different pharmacy, notify our office through [facility] or by phone at [phone]

## 2024-08-03 ENCOUNTER — Encounter: Payer: Self-pay | Admitting: Dermatology
